# Patient Record
Sex: Male | Born: 1985 | Race: White | Hispanic: No | Marital: Single | Smoking: Never smoker
Health system: Southern US, Community
[De-identification: ages and names within clinical notes are randomized; demographics above are authoritative.]

## PROBLEM LIST (undated history)

## (undated) DIAGNOSIS — R011 Cardiac murmur, unspecified: Secondary | ICD-10-CM

## (undated) DIAGNOSIS — F988 Other specified behavioral and emotional disorders with onset usually occurring in childhood and adolescence: Secondary | ICD-10-CM

## (undated) DIAGNOSIS — T7840XA Allergy, unspecified, initial encounter: Secondary | ICD-10-CM

## (undated) HISTORY — PX: HERNIA REPAIR: SHX51

## (undated) HISTORY — DX: Other specified behavioral and emotional disorders with onset usually occurring in childhood and adolescence: F98.8

## (undated) HISTORY — DX: Cardiac murmur, unspecified: R01.1

## (undated) HISTORY — DX: Allergy, unspecified, initial encounter: T78.40XA

---

## 2008-01-27 ENCOUNTER — Ambulatory Visit (HOSPITAL_COMMUNITY): Admission: RE | Admit: 2008-01-27 | Discharge: 2008-01-27 | Payer: Self-pay | Admitting: General Surgery

## 2010-08-14 NOTE — Op Note (Signed)
Leroy Walker, Leroy Walker                ACCOUNT NO.:  [id number]   MEDICAL RECORD NO.:  [id number]          PATIENT TYPE:  AMB   LOCATION:  SDS                          FACILITY:  [facility]   PHYSICIAN:  Angelia Mould. Derrell Lolling, M.D.DATE OF BIRTH:  01-08-1986   DATE OF PROCEDURE:  01/27/2008  DATE OF DISCHARGE:                               OPERATIVE REPORT   PREOPERATIVE DIAGNOSIS:  Left inguinal hernia.   POSTOPERATIVE DIAGNOSIS:  Left inguinal hernia.   OPERATION PERFORMED:  Repair of left inguinal hernia with UltraPro mesh  Office manager).   SURGEON:  Angelia Mould. Derrell Lolling, MD   OPERATIVE INDICATIONS:  This a 25 year old man, college student who  noticed a bulge in his left groin several months ago.  He was evaluated  and found to have a large reducible left inguinal hernia.  He has  elected to have this repaired at this time.  He has been counseled as an  outpatient.  He was brought to operating room electively.   OPERATIVE FINDINGS:  The patient had a huge indirect left inguinal  hernia.  There was a large volume of omentum within the hernia sac that  was reducible.  There were no adhesions in the hernia sac.   OPERATIVE TECHNIQUE:  Following the induction of general endotracheal  anesthesia, the patient's lower abdomen, groin, and genitalia were  prepped and draped in a sterile fashion.  Intravenous antibiotics were  given.  A time-out was held identifying the patient, procedure, and  correct site.  A 0.5% Marcaine with epinephrine was used as local  infiltration anesthetic.  A transverse slightly oblique incision was  made in the left groin overlying the inguinal canal.  Dissection was  carried down through the subcutaneous tissue exposing the aponeurosis of  the external oblique.  The external oblique was incised in the direction  of its fibers opening up the external inguinal ring.  The external  oblique was dissected away from the underlying tissues and self-  retaining  retractors were placed.  There was one sensory nerve  intimately associated with the cord structures.  This was isolated and  dissected all the way back as far lateral as possible from its emergence  from the muscle.  It was clamped, divided, and ligated with 2-0 silk  ties.  The distal nerve was resected.  The cord structures were  mobilized and encircled with a Penrose drain.  Cremasteric muscle fibers  were skeletonized circumferentially.  I dissected his very large  indirect hernia sac away from the cord structures all the way as far  distal as possible.  I opened the sac and then with ring forceps reduced  all the omentum back into the abdominal cavity.  I then amputated the  sac distally, leaving almost no sac behind with the testicle. I  dissected the indirect sac away from cord structures all the way back to  the level of the internal ring.  The indirect sac was then twisted and  then suture ligated at the level of the internal ring with suture  ligature of 2-0 silk.  The redundant sac  was excised and discarded.  The  floor of the inguinal canal was repaired and reinforced with an onlay  graft of UltraPro mesh, 3 inch x 6 inch size.  This mesh was trimmed at  the corners to accommodate the wound.  The mesh was sutured in place  with interrupted mattress sutures of 2-0 Prolene and running sutures of  2-0 Prolene.  The mesh was sutured so as to generously overlap the  fascia at the pubic tubercle, then along the inguinal ligament  inferiorly.  Medially and superomedially multiple interrupted mattress  sutures of 2-0 Prolene were placed.  Superolaterally, a running suture  of 2-0 Prolene was used.  The mesh was incised laterally so as to wrap  around the cord structures of the internal ring.  The suture lines were  completed laterally overlapping the tails of the mesh.  A couple of  sutures were placed, one medial and one lateral to the cord structures  to tighten up the mesh.  This  provided very secure repair both medial  and lateral to the cord structures, but allowed a fingertip opening for  the cord structures.  Hemostasis was excellent.  The wound was irrigated  with saline.  The external oblique was closed with running suture of 2-0  Vicryl placing the cord structures deep to the external oblique.  Scarpa  fascia was closed with 3-0 Vicryl sutures and skin closed with running  subcuticular suture of 4-0 Monocryl and Dermabond.  Clean bandages were  placed and the patient taken to recovery room in stable condition.  Estimated blood loss was about 10 mL.  Complications none.  Sponge,  needle, and instrument counts were correct.      Angelia Mould. Derrell Lolling, M.D.  Electronically Signed     HMI/MEDQ  D:  01/27/2008  T:  01/27/2008  Job:  [id number]   cc:   Harrel Lemon. Merla Riches, M.D.

## 2011-01-01 LAB — CBC
MCHC: 35.2
Platelets: 185
RBC: 4.61
WBC: 6.3

## 2012-07-16 ENCOUNTER — Ambulatory Visit (INDEPENDENT_AMBULATORY_CARE_PROVIDER_SITE_OTHER): Payer: BC Managed Care – PPO | Admitting: Family Medicine

## 2012-07-16 VITALS — BP 143/71 | HR 97 | Temp 98.3°F | Resp 16 | Ht 68.5 in | Wt 204.8 lb

## 2012-07-16 DIAGNOSIS — L237 Allergic contact dermatitis due to plants, except food: Secondary | ICD-10-CM

## 2012-07-16 DIAGNOSIS — L255 Unspecified contact dermatitis due to plants, except food: Secondary | ICD-10-CM

## 2012-07-16 MED ORDER — PREDNISONE 20 MG PO TABS: ORAL_TABLET | ORAL | Status: DC

## 2012-07-16 NOTE — Patient Instructions (Addendum)
1.  TAKE BENADRYL 25MG  ONE AT BEDTIME FOR ITCHING. 2.  TAKE ZYRTEC 10MG  DAILY FOR ITCHING. 3.  TAKE PREDNISONE DAILY FOR ALLERGIC REACTION.     Poison [employer] ivy is a inflammation of the skin (contact dermatitis) caused by touching the allergens on the leaves of the ivy plant following previous exposure to the plant. The rash usually appears 48 hours after exposure. The rash is usually bumps (papules) or blisters (vesicles) in a linear pattern. Depending on your own sensitivity, the rash may simply cause redness and itching, or it may also progress to blisters which may break open. These must be well cared for to prevent secondary bacterial (germ) infection, followed by scarring. Keep any open areas dry, clean, dressed, and covered with an antibacterial ointment if needed. The eyes may also get puffy. The puffiness is worst in the morning and gets better as the day progresses. This dermatitis usually heals without scarring, within 2 to 3 weeks without treatment. HOME CARE INSTRUCTIONS  Thoroughly wash with soap and water as soon as you have been exposed to poison ivy. You have about one half hour to remove the plant resin before it will cause the rash. This washing will destroy the oil or antigen on the skin that is causing, or will cause, the rash. Be sure to wash under your fingernails as any plant resin there will continue to spread the rash. Do not rub skin vigorously when washing affected area. Poison ivy cannot spread if no oil from the plant remains on your body. A rash that has progressed to weeping sores will not spread the rash unless you have not washed thoroughly. It is also important to wash any clothes you have been wearing as these may carry active allergens. The rash will return if you wear the unwashed clothing, even several days later. Avoidance of the plant in the future is the best measure. Poison ivy plant can be recognized by the number of leaves. Generally, poison ivy has three  leaves with flowering branches on a single stem. Diphenhydramine may be purchased over the counter and used as needed for itching. Do not drive with this medication if it makes you drowsy.Ask your caregiver about medication for children. SEEK MEDICAL CARE IF:  Open sores develop.  Redness spreads beyond area of rash.  You notice purulent (pus-like) discharge.  You have increased pain.  Other signs of infection develop (such as fever). Document Released: 03/15/2000 Document Revised: 06/10/2011 Document Reviewed: 02/01/2009 [facility] Patient Information 2013 [place], [place].

## 2012-07-16 NOTE — Progress Notes (Signed)
[address]   [place], [place]  [address]   [phone]  Subjective:    Patient ID: Leroy Walker, male    DOB: 02-Mar-1986, 27 y.o.   MRN: [id number]  HPI This 27 y.o. male presents for evaluation of vesicular rash.  Multiple exposures in past; previously worked in [employer].  Onset four days ago.  Went biking over the weekend; also went golfing over the weekend.  No fever/chills/sweats.  Bothers mostly at night.  No mucosal involvement.  Mostly arms, legs, abdominal region, genital region.    Review of Systems  Constitutional: Negative for fever, chills, diaphoresis and fatigue.  Eyes: Negative for pain, redness and itching.  Respiratory: Negative for shortness of breath.   Skin: Positive for color change, rash and wound. Negative for pallor.        Past Medical History  Diagnosis Date  . Allergy   . Heart murmur     Past Surgical History  Procedure Laterality Date  . Hernia repair      Prior to Admission medications   Medication Sig Start Date End Date Taking? Authorizing Provider  Multiple Vitamin (MULTIVITAMIN) tablet Take 1 tablet by mouth daily.   Yes Historical Provider, MD  predniSONE (DELTASONE) 20 MG tablet Three tablets daily x 1 day, then two tablets daily x 5 days, then one tablet daily x 5 days 07/16/12   Ethelda Chick, MD    Allergies  Allergen Reactions  . Cephalosporins Hives  . Penicillins Hives    History   Social History  . Marital Status: Single    Spouse Name: N/A    Number of Children: N/A  . Years of Education: N/A   Occupational History  . Not on file.   Social History Main Topics  . Smoking status: Never Smoker   . Smokeless tobacco: Not on file  . Alcohol Use: 2.4 oz/week    4 Cans of beer per week  . Drug Use: Yes    Special: Marijuana  . Sexually Active: No   Other Topics Concern  . Not on file   Social History Narrative   Employment: Emergency planning/management officer    Family History  Problem Relation Age of Onset  . Muscular  dystrophy Mother     Objective:   Physical Exam  Nursing note and vitals reviewed. Constitutional: He is oriented to person, place, and time. He appears well-developed and well-nourished. No distress.  Neurological: He is alert and oriented to person, place, and time.  Skin: He is not diaphoretic.  Maculopapular rash with linear distribution; scattered vesicles along rash along B forearms, B legs, scrotal region. No associated pustules or streaking.  Psychiatric: He has a normal mood and affect. His behavior is normal. Judgment and thought content normal.       Assessment & Plan:  Poison ivy dermatitis - Plan: predniSONE (DELTASONE) 20 MG tablet     1. Poison Ivy Dermatitis:  New.  rx for Prednisone provided for next eleven days; recommend Benadryl qhs and Zyrtec every morning.  Local wound care.  RTC for s/s secondary infection; discussed s/s.    Meds ordered this encounter  Medications  . Multiple Vitamin (MULTIVITAMIN) tablet    Sig: Take 1 tablet by mouth daily.  . predniSONE (DELTASONE) 20 MG tablet    Sig: Three tablets daily x 1 day, then two tablets daily x 5 days, then one tablet daily x 5 days    Dispense:  18 tablet  Refill:  0

## 2014-01-04 ENCOUNTER — Ambulatory Visit (INDEPENDENT_AMBULATORY_CARE_PROVIDER_SITE_OTHER): Payer: BC Managed Care – PPO | Admitting: Internal Medicine

## 2014-01-04 VITALS — BP 124/82 | HR 76 | Temp 98.3°F | Resp 16 | Ht 69.5 in | Wt 220.6 lb

## 2014-01-04 DIAGNOSIS — J302 Other seasonal allergic rhinitis: Secondary | ICD-10-CM

## 2014-01-04 DIAGNOSIS — J018 Other acute sinusitis: Secondary | ICD-10-CM

## 2014-01-04 MED ORDER — FLUTICASONE PROPIONATE 50 MCG/ACT NA SUSP
2.0000 | Freq: Every day | NASAL | Status: DC
Start: 2014-01-04 — End: 2014-08-11

## 2014-01-04 MED ORDER — AZITHROMYCIN 500 MG PO TABS: 500.0000 mg | ORAL_TABLET | Freq: Every day | ORAL | Status: DC

## 2014-01-04 NOTE — Patient Instructions (Signed)
Allergic Rhinitis Allergic rhinitis is when the mucous membranes in the nose respond to allergens. Allergens are particles in the air that cause your body to have an allergic reaction. This causes you to release allergic antibodies. Through a chain of events, these eventually cause you to release histamine into the blood stream. Although meant to protect the body, it is this release of histamine that causes your discomfort, such as frequent sneezing, congestion, and an itchy, runny nose.  CAUSES  Seasonal allergic rhinitis (hay fever) is caused by pollen allergens that may come from grasses, trees, and weeds. Year-round allergic rhinitis (perennial allergic rhinitis) is caused by allergens such as house dust mites, pet dander, and mold spores.  SYMPTOMS   Nasal stuffiness (congestion).  Itchy, runny nose with sneezing and tearing of the eyes. DIAGNOSIS  Your health care provider can help you determine the allergen or allergens that trigger your symptoms. If you and your health care provider are unable to determine the allergen, skin or blood testing may be used. TREATMENT  Allergic rhinitis does not have a cure, but it can be controlled by:  Medicines and allergy shots (immunotherapy).  Avoiding the allergen. Hay fever may often be treated with antihistamines in pill or nasal spray forms. Antihistamines block the effects of histamine. There are over-the-counter medicines that may help with nasal congestion and swelling around the eyes. Check with your health care provider before taking or giving this medicine.  If avoiding the allergen or the medicine prescribed do not work, there are many new medicines your health care provider can prescribe. Stronger medicine may be used if initial measures are ineffective. Desensitizing injections can be used if medicine and avoidance does not work. Desensitization is when a patient is given ongoing shots until the body becomes less sensitive to the allergen.  Make sure you follow up with your health care provider if problems continue. HOME CARE INSTRUCTIONS It is not possible to completely avoid allergens, but you can reduce your symptoms by taking steps to limit your exposure to them. It helps to know exactly what you are allergic to so that you can avoid your specific triggers. SEEK MEDICAL CARE IF:   You have a fever.  You develop a cough that does not stop easily (persistent).  You have shortness of breath.  You start wheezing.  Symptoms interfere with normal daily activities. Document Released: 12/11/2000 Document Revised: 03/23/2013 Document Reviewed: 11/23/2012 Exit[employer] Patient Information 2015 [employer]. This information is not intended to replace advice given to you by your health care provider. Make sure you discuss any questions you have with your health care provider. Sinusitis Sinusitis is redness, soreness, and inflammation of the paranasal sinuses. Paranasal sinuses are air pockets within the bones of your face (beneath the eyes, the middle of the forehead, or above the eyes). In healthy paranasal sinuses, mucus is able to drain out, and air is able to circulate through them by way of your nose. However, when your paranasal sinuses are inflamed, mucus and air can become trapped. This can allow bacteria and other germs to grow and cause infection. Sinusitis can develop quickly and last only a short time (acute) or continue over a long period (chronic). Sinusitis that lasts for more than 12 weeks is considered chronic.  CAUSES  Causes of sinusitis include:  Allergies.  Structural abnormalities, such as displacement of the cartilage that separates your nostrils (deviated septum), which can decrease the air flow through your nose and sinuses and affect sinus   drainage.  Functional abnormalities, such as when the small hairs (cilia) that line your sinuses and help remove mucus do not work properly or are not present. SIGNS AND  SYMPTOMS  Symptoms of acute and chronic sinusitis are the same. The primary symptoms are pain and pressure around the affected sinuses. Other symptoms include:  Upper toothache.  Earache.  Headache.  Bad breath.  Decreased sense of smell and taste.  A cough, which worsens when you are lying flat.  Fatigue.  Fever.  Thick drainage from your nose, which often is green and may contain pus (purulent).  Swelling and warmth over the affected sinuses. DIAGNOSIS  Your health care provider will perform a physical exam. During the exam, your health care provider may:  Look in your nose for signs of abnormal growths in your nostrils (nasal polyps).  Tap over the affected sinus to check for signs of infection.  View the inside of your sinuses (endoscopy) using an imaging device that has a light attached (endoscope). If your health care provider suspects that you have chronic sinusitis, one or more of the following tests may be recommended:  Allergy tests.  Nasal culture. A sample of mucus is taken from your nose, sent to a lab, and screened for bacteria.  Nasal cytology. A sample of mucus is taken from your nose and examined by your health care provider to determine if your sinusitis is related to an allergy. TREATMENT  Most cases of acute sinusitis are related to a viral infection and will resolve on their own within 10 days. Sometimes medicines are prescribed to help relieve symptoms (pain medicine, decongestants, nasal steroid sprays, or saline sprays).  However, for sinusitis related to a bacterial infection, your health care provider will prescribe antibiotic medicines. These are medicines that will help kill the bacteria causing the infection.  Rarely, sinusitis is caused by a fungal infection. In theses cases, your health care provider will prescribe antifungal medicine. For some cases of chronic sinusitis, surgery is needed. Generally, these are cases in which sinusitis recurs  more than 3 times per year, despite other treatments. HOME CARE INSTRUCTIONS   Drink plenty of water. Water helps thin the mucus so your sinuses can drain more easily.  Use a humidifier.  Inhale steam 3 to 4 times a day (for example, sit in the bathroom with the shower running).  Apply a warm, moist washcloth to your face 3 to 4 times a day, or as directed by your health care provider.  Use saline nasal sprays to help moisten and clean your sinuses.  Take medicines only as directed by your health care provider.  If you were prescribed either an antibiotic or antifungal medicine, finish it all even if you start to feel better. SEEK IMMEDIATE MEDICAL CARE IF:  You have increasing pain or severe headaches.  You have nausea, vomiting, or drowsiness.  You have swelling around your face.  You have vision problems.  You have a stiff neck.  You have difficulty breathing. MAKE SURE YOU:   Understand these instructions.  Will watch your condition.  Will get help right away if you are not doing well or get worse. Document Released: 03/18/2005 Document Revised: 08/02/2013 Document Reviewed: 04/02/2011 Exit[employer] Patient Information 2015 [employer]. This information is not intended to replace advice given to you by your health care provider. Make sure you discuss any questions you have with your health care provider.  

## 2014-01-04 NOTE — Progress Notes (Signed)
   Subjective:    Patient ID: Leroy Walker, male    DOB: 1985-12-18, 28 y.o.   MRN: [id number]  HPI  Patient is being seen at clinic for his sinus. He noticed it for 3 to 5 days. He has a lot sinus pressure with nasal congestion and yellowish/green mucous. Most of the pressure around the eyes and nose. Sore throat is more like a tingle and starting to have difficulty to swallow. Patient had some chills and some night sweats. Some pressure around the ears. No wheezing. No coughing. Patient denies any fever. No N/V. Patient has seasonal allergies and felt worse when the season changes.  He travels a lot for work.   Review of Systems     Objective:   Physical Exam        Assessment & Plan:  Sinusitis/allergic rhinitis Zithromax 500mg /Fluticasone nasal

## 2014-08-11 ENCOUNTER — Telehealth: Payer: Self-pay

## 2014-08-11 DIAGNOSIS — J018 Other acute sinusitis: Secondary | ICD-10-CM

## 2014-08-11 DIAGNOSIS — J302 Other seasonal allergic rhinitis: Secondary | ICD-10-CM

## 2014-08-11 MED ORDER — FLUTICASONE PROPIONATE 50 MCG/ACT NA SUSP: 2.0000 | Freq: Every day | NASAL | Status: DC

## 2014-08-11 NOTE — Telephone Encounter (Signed)
Rx sent, but needs OV for additional refills. Reminder on Rx.

## 2014-08-11 NOTE — Telephone Encounter (Signed)
Pt requesting refill on fluticasone (FLONASE) 50 MCG/ACT nasal spray [[id number]][[id number]]

## 2015-09-20 ENCOUNTER — Ambulatory Visit (INDEPENDENT_AMBULATORY_CARE_PROVIDER_SITE_OTHER): Payer: BLUE CROSS/BLUE SHIELD | Admitting: Internal Medicine

## 2015-09-20 VITALS — BP 132/68 | HR 88 | Temp 99.3°F | Resp 16 | Wt 199.2 lb

## 2015-09-20 DIAGNOSIS — J039 Acute tonsillitis, unspecified: Secondary | ICD-10-CM

## 2015-09-20 LAB — POCT RAPID STREP A (OFFICE): RAPID STREP A SCREEN: POSITIVE — AB

## 2015-09-20 MED ORDER — PREDNISONE 20 MG PO TABS
ORAL_TABLET | ORAL | Status: DC
Start: 2015-09-20 — End: 2018-03-03

## 2015-09-20 MED ORDER — CLARITHROMYCIN 500 MG PO TABS: 500.0000 mg | ORAL_TABLET | Freq: Two times a day (BID) | ORAL | Status: DC

## 2015-09-20 MED ORDER — LIDOCAINE VISCOUS 2 % MT SOLN: OROMUCOSAL | Status: DC

## 2015-09-20 NOTE — Progress Notes (Signed)
Subjective:  By signing my name below, I, Stann Ore, attest that this documentation has been prepared under the direction and in the presence of Ellamae Sia, MD. Electronically Signed: Stann Ore, Scribe. 09/20/2015 , 3:53 PM .  Patient was seen in Room 8 .   Patient ID: Leroy Walker III, male    DOB: 12-12-1985, 29 y.o.   MRN: [id number] Chief Complaint  Patient presents with  . Sore Throat    x 2 days  . Sinusitis    x 2 days    HPI Leroy Walker III is a 30 y.o. male who presents to [facility] complaining of sore throat and sinus pressure that started 2 days ago. 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 He mentions having a slight fever and difficulty sleeping. He's taken flonase without relief. He denies nasal congestion or cough.   He's had mono when he was younger.   There are no active problems to display for this patient.   Current outpatient prescriptions:  .  fluticasone (FLONASE) 50 MCG/ACT nasal spray, Place 2 sprays into both nostrils daily., Disp: 16 g, Rfl: 0 .  Multiple Vitamin (MULTIVITAMIN) tablet, Take 1 tablet by mouth daily., Disp: , Rfl:  .  azithromycin (ZITHROMAX) 500 MG tablet, Take 1 tablet (500 mg total) by mouth daily. (Patient not taking: Reported on 09/20/2015), Disp: 5 tablet, Rfl: 0 .  predniSONE (DELTASONE) 20 MG tablet, Three tablets daily x 1 day, then two tablets daily x 5 days, then one tablet daily x 5 days (Patient not taking: Reported on 09/20/2015), Disp: 18 tablet, Rfl: 0 Allergies  Allergen Reactions  . Cephalosporins Hives  . Penicillins Hives   Review of Systems  Constitutional: Positive for fever, appetite change and fatigue. Negative for chills.  HENT: Positive for sinus pressure and sore throat. Negative for congestion and rhinorrhea.   Respiratory: Negative for cough, shortness of breath and wheezing.   Gastrointestinal: Negative for nausea, vomiting and diarrhea.       Objective:   Physical Exam  Constitutional: He is oriented to person, place, and time. He appears well-developed and well-nourished. No distress.  HENT:  Head: Normocephalic and atraumatic.  Mouth/Throat: Posterior oropharyngeal erythema present. No oropharyngeal exudate.  3+ tonsils   Eyes: EOM are normal. Pupils are equal, round, and reactive to light.  Neck: Neck supple.  Cardiovascular: Normal rate.   Pulmonary/Chest: Effort normal. No respiratory distress.  Musculoskeletal: Normal range of motion.  Lymphadenopathy:  Minimal ac nodes  Neurological: He is alert and oriented to person, place, and time.  Skin: Skin is warm and dry.  Psychiatric: He has a normal mood and affect. His behavior is normal.  Nursing note and vitals reviewed.   BP 132/68 mmHg  Pulse 88  Temp(Src) 99.3 F (37.4 C) (Oral)  Resp 16  Wt 199 lb 3.2 oz (90.357 kg)  SpO2 99%   Results for orders placed or performed in visit on 09/20/15  POCT rapid strep A  Result Value Ref Range   Rapid Strep A Screen Positive (A) Negative       Assessment & Plan:  Acute tonsillitis, unspecified etiology - Plan: POCT rapid strep A  Meds ordered this encounter  Medications  . clarithromycin (BIAXIN) 500 MG tablet    Sig: Take 1 tablet (500 mg total) by mouth 2 (two) times daily.    Dispense:  20 tablet    Refill:  0  . lidocaine (XYLOCAINE) 2 % solution  Sig: Use 1 teaspoon every 2 hours to swish and swallow or spit as needed for pain    Dispense:  60 mL    Refill:  0  . predniSONE (DELTASONE) 20 MG tablet    Sig: 3/2/1 single daily dose for 3 days    Dispense:  6 tablet    Refill:  0   I have completed the patient encounter in its entirety as documented by the scribe, with editing by me where necessary. Robert P. Merla Richesoolittle, M.D.

## 2018-03-01 ENCOUNTER — Encounter (HOSPITAL_COMMUNITY): Payer: Self-pay

## 2018-03-01 ENCOUNTER — Observation Stay (HOSPITAL_COMMUNITY)
Admission: EM | Admit: 2018-03-01 | Discharge: 2018-03-03 | Disposition: A | Discharge status: Home / Self Care | Payer: PRIVATE HEALTH INSURANCE | Attending: Orthopaedic Surgery | Admitting: Orthopaedic Surgery

## 2018-03-01 ENCOUNTER — Emergency Department (HOSPITAL_COMMUNITY): Payer: PRIVATE HEALTH INSURANCE

## 2018-03-01 ENCOUNTER — Other Ambulatory Visit: Payer: Self-pay

## 2018-03-01 DIAGNOSIS — Y9322 Activity, ice hockey: Secondary | ICD-10-CM | POA: Insufficient documentation

## 2018-03-01 DIAGNOSIS — Z79899 Other long term (current) drug therapy: Secondary | ICD-10-CM | POA: Diagnosis not present

## 2018-03-01 DIAGNOSIS — Z7951 Long term (current) use of inhaled steroids: Secondary | ICD-10-CM | POA: Diagnosis not present

## 2018-03-01 DIAGNOSIS — S82831A Other fracture of upper and lower end of right fibula, initial encounter for closed fracture: Secondary | ICD-10-CM | POA: Diagnosis not present

## 2018-03-01 DIAGNOSIS — S82241A Displaced spiral fracture of shaft of right tibia, initial encounter for closed fracture: Principal | ICD-10-CM | POA: Insufficient documentation

## 2018-03-01 DIAGNOSIS — W1830XA Fall on same level, unspecified, initial encounter: Secondary | ICD-10-CM | POA: Diagnosis not present

## 2018-03-01 DIAGNOSIS — S82251A Displaced comminuted fracture of shaft of right tibia, initial encounter for closed fracture: Secondary | ICD-10-CM | POA: Diagnosis present

## 2018-03-01 DIAGNOSIS — Z09 Encounter for follow-up examination after completed treatment for conditions other than malignant neoplasm: Secondary | ICD-10-CM

## 2018-03-01 DIAGNOSIS — T1490XA Injury, unspecified, initial encounter: Secondary | ICD-10-CM

## 2018-03-01 LAB — CBC WITH DIFFERENTIAL/PLATELET
Abs Immature Granulocytes: 0.13 10*3/uL — ABNORMAL HIGH (ref 0.00–0.07)
Basophils Absolute: 0 10*3/uL (ref 0.0–0.1)
Basophils Relative: 0 %
EOS PCT: 0 %
Eosinophils Absolute: 0 10*3/uL (ref 0.0–0.5)
HCT: 42.6 % (ref 39.0–52.0)
Hemoglobin: 14.3 g/dL (ref 13.0–17.0)
Immature Granulocytes: 1 %
Lymphocytes Relative: 10 %
Lymphs Abs: 1.4 10*3/uL (ref 0.7–4.0)
MCH: 33.1 pg (ref 26.0–34.0)
MCHC: 33.6 g/dL (ref 30.0–36.0)
MCV: 98.6 fL (ref 80.0–100.0)
MONO ABS: 0.8 10*3/uL (ref 0.1–1.0)
Monocytes Relative: 6 %
Neutro Abs: 11.9 10*3/uL — ABNORMAL HIGH (ref 1.7–7.7)
Neutrophils Relative %: 83 %
Platelets: 244 10*3/uL (ref 150–400)
RBC: 4.32 MIL/uL (ref 4.22–5.81)
RDW: 12.1 % (ref 11.5–15.5)
WBC: 14.3 10*3/uL — ABNORMAL HIGH (ref 4.0–10.5)
nRBC: 0 % (ref 0.0–0.2)

## 2018-03-01 LAB — BASIC METABOLIC PANEL
Anion gap: 12 (ref 5–15)
BUN: 17 mg/dL (ref 6–20)
CO2: 21 mmol/L — AB (ref 22–32)
Calcium: 9.3 mg/dL (ref 8.9–10.3)
Chloride: 104 mmol/L (ref 98–111)
Creatinine, Ser: 0.86 mg/dL (ref 0.61–1.24)
GFR calc Af Amer: >60 mL/min (ref >60)
GFR calc non Af Amer: >60 mL/min (ref >60)
Glucose, Bld: 104 mg/dL — ABNORMAL HIGH (ref 70–99)
Potassium: 3.6 mmol/L (ref 3.5–5.1)
Sodium: 137 mmol/L (ref 135–145)

## 2018-03-01 MED ORDER — DOCUSATE SODIUM 100 MG PO CAPS
100.0000 mg | ORAL_CAPSULE | Freq: Two times a day (BID) | ORAL | Status: DC
  Administered 2018-03-02 – 2018-03-03 (×3): 100 mg via ORAL
  Filled 2018-03-01 (×3): qty 1

## 2018-03-01 MED ORDER — ONDANSETRON HCL 4 MG PO TABS: 4.0000 mg | ORAL_TABLET | Freq: Four times a day (QID) | ORAL | Status: DC | PRN

## 2018-03-01 MED ORDER — HYDROCODONE-ACETAMINOPHEN 5-325 MG PO TABS
2.0000 | ORAL_TABLET | Freq: Once | ORAL | Status: AC
  Administered 2018-03-01: 2 via ORAL
  Filled 2018-03-01: qty 2

## 2018-03-01 MED ORDER — FENTANYL CITRATE (PF) 100 MCG/2ML IJ SOLN
100.0000 ug | Freq: Once | INTRAMUSCULAR | Status: AC
  Administered 2018-03-01: 100 ug via INTRAMUSCULAR
  Filled 2018-03-01: qty 2

## 2018-03-01 MED ORDER — DIPHENHYDRAMINE HCL 12.5 MG/5ML PO ELIX: 12.5000 mg | ORAL_SOLUTION | ORAL | Status: DC | PRN

## 2018-03-01 MED ORDER — METHOCARBAMOL 1000 MG/10ML IJ SOLN: 500.0000 mg | Freq: Four times a day (QID) | INTRAVENOUS | Status: DC | PRN

## 2018-03-01 MED ORDER — ONDANSETRON HCL 4 MG/2ML IJ SOLN: 4.0000 mg | Freq: Four times a day (QID) | INTRAMUSCULAR | Status: DC | PRN

## 2018-03-01 MED ORDER — ACETAMINOPHEN 500 MG PO TABS
1000.0000 mg | ORAL_TABLET | Freq: Four times a day (QID) | ORAL | Status: DC
  Administered 2018-03-02 (×2): 1000 mg via ORAL
  Filled 2018-03-01 (×2): qty 2

## 2018-03-01 MED ORDER — OXYCODONE HCL 5 MG PO TABS
5.0000 mg | ORAL_TABLET | ORAL | Status: DC | PRN
  Administered 2018-03-02 – 2018-03-03 (×4): 10 mg via ORAL
  Filled 2018-03-01 (×4): qty 2

## 2018-03-01 MED ORDER — MORPHINE SULFATE (PF) 2 MG/ML IV SOLN
2.0000 mg | INTRAVENOUS | Status: DC | PRN
  Administered 2018-03-02 – 2018-03-03 (×4): 2 mg via INTRAVENOUS
  Filled 2018-03-01 (×4): qty 1

## 2018-03-01 MED ORDER — METHOCARBAMOL 500 MG PO TABS
500.0000 mg | ORAL_TABLET | Freq: Four times a day (QID) | ORAL | Status: DC | PRN
  Administered 2018-03-02 – 2018-03-03 (×4): 500 mg via ORAL
  Filled 2018-03-01 (×4): qty 1

## 2018-03-01 MED ORDER — FENTANYL CITRATE (PF) 100 MCG/2ML IJ SOLN
50.0000 ug | Freq: Once | INTRAMUSCULAR | Status: AC
  Administered 2018-03-01: 50 ug via INTRAVENOUS
  Filled 2018-03-01: qty 2

## 2018-03-01 MED ORDER — LACTATED RINGERS IV SOLN
INTRAVENOUS | Status: DC
  Administered 2018-03-02 (×3): via INTRAVENOUS

## 2018-03-01 NOTE — Consult Note (Signed)
Patient with displaced tibial shaft fracture, will require IM nail tomorrow, will meet patient in morning, low energy, reportedly compartments soft.

## 2018-03-01 NOTE — ED Notes (Signed)
Report given to [facility] 

## 2018-03-01 NOTE — ED Provider Notes (Signed)
[facility] Note   CSN: [id number] Arrival date & time: 03/01/18  2102     History   Chief Complaint Chief Complaint  Patient presents with  . Leg Injury    HPI Leroy Walker is a 32 y.o. male past medical history of ADHD who presents for evaluation of right lower leg pain that began after mechanical fall while playing hockey.  Patient reports that he was playing ice hockey and states that he was collided with a player, causing him to get knocked down.  He states that his leg bent different way.  He has not been able to ambulate or bear weight on the leg since the incident.  He has not taken medication for pain.  Patient denies any numbness/weakness.  He denies any head injury or LOC.  The history is provided by the patient.    Past Medical History:  Diagnosis Date  . Allergy   . Attention deficit disorder (ADD)   . Heart murmur     Patient Active Problem List   Diagnosis Date Noted  . Displaced comminuted fracture of shaft of right tibia, initial encounter for closed fracture 03/01/2018    Past Surgical History:  Procedure Laterality Date  . HERNIA REPAIR          Home Medications    Prior to Admission medications   Medication Sig Start Date End Date Taking? Authorizing Provider  azithromycin (ZITHROMAX) 500 MG tablet Take 1 tablet (500 mg total) by mouth daily. Patient not taking: Reported on 09/20/2015 01/04/14   Jonita Albee, MD  clarithromycin (BIAXIN) 500 MG tablet Take 1 tablet (500 mg total) by mouth 2 (two) times daily. 09/20/15   Tonye Pearson, MD  fluticasone (FLONASE) 50 MCG/ACT nasal spray Place 2 sprays into both nostrils daily. 08/11/14   Jonita Albee, MD  lidocaine (XYLOCAINE) 2 % solution Use 1 teaspoon every 2 hours to swish and swallow or spit as needed for pain 09/20/15   Tonye Pearson, MD  Multiple Vitamin (MULTIVITAMIN) tablet Take 1 tablet by mouth daily.    [provider]    predniSONE (DELTASONE) 20 MG tablet 3/2/1 single daily dose for 3 days 09/20/15   Tonye Pearson, MD    Family History Family History  Problem Relation Age of Onset  . Muscular dystrophy Mother   . Stroke Maternal Grandfather   . Stroke Paternal Grandmother   . Stroke Paternal Grandfather     Social History Social History   Tobacco Use  . Smoking status: Never Smoker  . Smokeless tobacco: Never Used  Substance Use Topics  . Alcohol use: Yes    Alcohol/week: 4.0 standard drinks    Types: 4 Cans of beer per week  . Drug use: Yes    Types: Marijuana     Allergies   Cephalosporins and Penicillins   Review of Systems Review of Systems  Musculoskeletal:       Right leg pain  Neurological: Negative for weakness and numbness.  All other systems reviewed and are negative.    Physical Exam Updated Vital Signs BP 139/89 (BP Location: Right Arm)   Pulse 79   Temp 97.9 F (36.6 C) (Oral)   Resp 16   Ht 5\' 11"  (1.803 m)   Wt 90.7 kg   SpO2 100%   BMI 27.89 kg/m   Physical Exam  Constitutional: He appears well-developed and well-nourished.  HENT:  Head: Normocephalic and atraumatic.  Eyes:  Conjunctivae and EOM are normal. Right eye exhibits no discharge. Left eye exhibits no discharge. No scleral icterus.  Cardiovascular:  Pulses:      Dorsalis pedis pulses are 2+ on the right side, and 2+ on the left side.  Pulmonary/Chest: Effort normal.  Musculoskeletal:       Legs: Tenderness palpation noted to right lateral tib-fib with crepitus noted.  There is overlying soft tissue swelling and ecchymosis.  Good range of motion secondary to pain.  No bony tenderness noted to distal tib-fib, ankle, dorsal aspect of foot.  Patient is able to wiggle all 5 toes without any difficulty.  No tenderness palpation of the right knee.  No tenderness palpation noted to left lower extremity.  Neurological: He is alert.  Sensation intact along major nerve distributions of BLE  Skin:  Skin is warm and dry. Capillary refill takes less than 2 seconds.  Good distal cap refill.  LLE is not dusky in appearance or cool to touch.  Psychiatric: He has a normal mood and affect. His speech is normal and behavior is normal.  Nursing note and vitals reviewed.    ED Treatments / Results  Labs (all labs ordered are listed, but only abnormal results are displayed) Labs Reviewed  HIV ANTIBODY (ROUTINE TESTING W REFLEX)  BASIC METABOLIC PANEL  CBC WITH DIFFERENTIAL/PLATELET    EKG None  Radiology Dg Tibia/fibula Right  Result Date: 03/01/2018 CLINICAL DATA:  32 year old male with trauma to right lower extremity. EXAM: RIGHT TIBIA AND FIBULA - 2 VIEW; RIGHT ANKLE - COMPLETE 3+ VIEW COMPARISON:  None. FINDINGS: There is a mildly displaced oblique fracture of the proximal fibula. There is a displaced oblique fracture of the distal tibia with approximately 6 mm distraction gap and 1 cm anterior displacement of distal fracture fragment. The bones are well mineralized. There is no dislocation. There is subcutaneous soft tissue edema of the distal leg. IMPRESSION: Mildly displaced oblique fractures of the proximal fibula and distal tibia. No dislocation. Electronically Signed   By: Elgie Collard M.D.   On: 03/01/2018 22:27   Dg Ankle Complete Right  Result Date: 03/01/2018 CLINICAL DATA:  32 year old male with trauma to right lower extremity. EXAM: RIGHT TIBIA AND FIBULA - 2 VIEW; RIGHT ANKLE - COMPLETE 3+ VIEW COMPARISON:  None. FINDINGS: There is a mildly displaced oblique fracture of the proximal fibula. There is a displaced oblique fracture of the distal tibia with approximately 6 mm distraction gap and 1 cm anterior displacement of distal fracture fragment. The bones are well mineralized. There is no dislocation. There is subcutaneous soft tissue edema of the distal leg. IMPRESSION: Mildly displaced oblique fractures of the proximal fibula and distal tibia. No dislocation.  Electronically Signed   By: Elgie Collard M.D.   On: 03/01/2018 22:27    Procedures Procedures (including critical care time)  Medications Ordered in ED Medications  lactated ringers infusion (has no administration in time range)  acetaminophen (TYLENOL) tablet 1,000 mg (has no administration in time range)  oxyCODONE (Oxy IR/ROXICODONE) immediate release tablet 5-10 mg (has no administration in time range)  morphine 2 MG/ML injection 2 mg (has no administration in time range)  methocarbamol (ROBAXIN) tablet 500 mg (has no administration in time range)    Or  methocarbamol (ROBAXIN) 500 mg in dextrose 5 % 50 mL IVPB (has no administration in time range)  diphenhydrAMINE (BENADRYL) 12.5 MG/5ML elixir 12.5-25 mg (has no administration in time range)  docusate sodium (COLACE) capsule 100 mg (has no  administration in time range)  ondansetron (ZOFRAN) tablet 4 mg (has no administration in time range)    Or  ondansetron (ZOFRAN) injection 4 mg (has no administration in time range)  fentaNYL (SUBLIMAZE) injection 50 mcg (has no administration in time range)  fentaNYL (SUBLIMAZE) injection 100 mcg (100 mcg Intramuscular Given 03/01/18 2136)  HYDROcodone-acetaminophen (NORCO/VICODIN) 5-325 MG per tablet 2 tablet (2 tablets Oral Given 03/01/18 2235)     Initial Impression / Assessment and Plan / ED Course  I have reviewed the triage vital signs and the nursing notes.  Pertinent labs & imaging results that were available during my care of the patient were reviewed by me and considered in my medical decision making (see chart for details).     32 year old male who presents for evaluation of left lower extremity pain that began after mechanical fall playing hockey. Patient is afebrile, non-toxic appearing, sitting comfortably on examination table. Vital signs reviewed and stable.  Patient is neurovascularly intact.  Concern for fracture versus dislocation versus musculoskeletal injury.  X-rays  ordered at triage.  XR review. Patient with mildly displaced oblique fracture of the proximal fibula and distal tibia.  There is about 6 mm distraction gap with 1 cm anterior displacement of the distal fracture fragment.  Ortho consulted.   Discussed patient with Dr. Everardo PacificVarkey (Ortho) who reviewed patient's imaging.  He recommends patient be placed in a short leg splint with lateral stirrups.  He would [facility] for plans for surgical intervention tomorrow morning.  Updated patient on plan.  He is agreeable.   Final Clinical Impressions(s) / ED Diagnoses   Final diagnoses:  Other closed fracture of proximal end of right fibula, initial encounter  Closed displaced spiral fracture of shaft of right tibia, initial encounter    ED Discharge Orders    None       Maxwell CaulLayden, Ikran Patman A, PA-C 03/01/18 2304    Melene PlanFloyd, Dan, DO 03/02/18 0019

## 2018-03-01 NOTE — ED Notes (Signed)
Bed: WTR5 Expected date:  Expected time:  Means of arrival:  Comments: 

## 2018-03-01 NOTE — ED Triage Notes (Signed)
Pt reports that he was playing ice hockey and injured is R lower leg. Splinted with a clipboard. A&Ox4. No head injury.

## 2018-03-02 ENCOUNTER — Encounter (HOSPITAL_COMMUNITY): Payer: Self-pay | Admitting: Certified Registered"

## 2018-03-02 ENCOUNTER — Observation Stay (HOSPITAL_COMMUNITY): Payer: PRIVATE HEALTH INSURANCE

## 2018-03-02 ENCOUNTER — Observation Stay (HOSPITAL_COMMUNITY): Payer: PRIVATE HEALTH INSURANCE | Admitting: Anesthesiology

## 2018-03-02 ENCOUNTER — Encounter (HOSPITAL_COMMUNITY)
Admission: EM | Disposition: A | Discharge status: Home / Self Care | Payer: Self-pay | Source: Home / Self Care | Attending: Emergency Medicine

## 2018-03-02 HISTORY — PX: TIBIA IM NAIL INSERTION: SHX2516

## 2018-03-02 LAB — SURGICAL PCR SCREEN
MRSA, PCR: NEGATIVE
Staphylococcus aureus: POSITIVE — AB

## 2018-03-02 LAB — HIV ANTIBODY (ROUTINE TESTING W REFLEX): HIV SCREEN 4TH GENERATION: NONREACTIVE

## 2018-03-02 SURGERY — INSERTION, INTRAMEDULLARY ROD, TIBIA
Anesthesia: General | Site: Leg Lower | Laterality: Right

## 2018-03-02 MED ORDER — MUPIROCIN 2 % EX OINT: 1.0000 "application " | TOPICAL_OINTMENT | Freq: Two times a day (BID) | CUTANEOUS | Status: DC

## 2018-03-02 MED ORDER — SUCCINYLCHOLINE CHLORIDE 200 MG/10ML IV SOSY
PREFILLED_SYRINGE | INTRAVENOUS | Status: AC
  Filled 2018-03-02: qty 10

## 2018-03-02 MED ORDER — ONDANSETRON HCL 4 MG/2ML IJ SOLN
INTRAMUSCULAR | Status: AC
  Filled 2018-03-02: qty 2

## 2018-03-02 MED ORDER — EPHEDRINE 5 MG/ML INJ
INTRAVENOUS | Status: AC
  Filled 2018-03-02: qty 10

## 2018-03-02 MED ORDER — LIDOCAINE HCL (CARDIAC) PF 100 MG/5ML IV SOSY
PREFILLED_SYRINGE | INTRAVENOUS | Status: DC | PRN
  Administered 2018-03-02: 100 mg via INTRAVENOUS

## 2018-03-02 MED ORDER — DEXAMETHASONE SODIUM PHOSPHATE 10 MG/ML IJ SOLN
INTRAMUSCULAR | Status: DC | PRN
  Administered 2018-03-02: 4 mg via INTRAVENOUS

## 2018-03-02 MED ORDER — HYDROMORPHONE HCL 1 MG/ML IJ SOLN
0.2500 mg | INTRAMUSCULAR | Status: DC | PRN
  Administered 2018-03-02 (×4): 0.5 mg via INTRAVENOUS

## 2018-03-02 MED ORDER — PROPOFOL 10 MG/ML IV BOLUS
INTRAVENOUS | Status: AC
  Filled 2018-03-02: qty 20

## 2018-03-02 MED ORDER — CHLORHEXIDINE GLUCONATE CLOTH 2 % EX PADS
6.0000 | MEDICATED_PAD | Freq: Every day | CUTANEOUS | Status: DC
  Administered 2018-03-02 – 2018-03-03 (×2): 6 via TOPICAL

## 2018-03-02 MED ORDER — CLINDAMYCIN PHOSPHATE 600 MG/50ML IV SOLN
600.0000 mg | Freq: Four times a day (QID) | INTRAVENOUS | Status: AC
  Administered 2018-03-02 – 2018-03-03 (×2): 600 mg via INTRAVENOUS
  Filled 2018-03-02 (×2): qty 50

## 2018-03-02 MED ORDER — MUPIROCIN 2 % EX OINT
1.0000 "application " | TOPICAL_OINTMENT | Freq: Two times a day (BID) | CUTANEOUS | Status: DC
  Administered 2018-03-02 – 2018-03-03 (×3): 1 via NASAL
  Filled 2018-03-02 (×2): qty 22

## 2018-03-02 MED ORDER — VANCOMYCIN HCL 1000 MG IV SOLR
INTRAVENOUS | Status: AC
  Filled 2018-03-02: qty 1000

## 2018-03-02 MED ORDER — HYDROMORPHONE HCL 1 MG/ML IJ SOLN
INTRAMUSCULAR | Status: AC
  Filled 2018-03-02: qty 1

## 2018-03-02 MED ORDER — METOCLOPRAMIDE HCL 5 MG/ML IJ SOLN: 5.0000 mg | Freq: Three times a day (TID) | INTRAMUSCULAR | Status: DC | PRN

## 2018-03-02 MED ORDER — MIDAZOLAM HCL 5 MG/5ML IJ SOLN
INTRAMUSCULAR | Status: DC | PRN
  Administered 2018-03-02: 2 mg via INTRAVENOUS

## 2018-03-02 MED ORDER — DEXAMETHASONE SODIUM PHOSPHATE 10 MG/ML IJ SOLN
INTRAMUSCULAR | Status: AC
  Filled 2018-03-02: qty 1

## 2018-03-02 MED ORDER — MIDAZOLAM HCL 2 MG/2ML IJ SOLN
INTRAMUSCULAR | Status: AC
  Filled 2018-03-02: qty 2

## 2018-03-02 MED ORDER — FENTANYL CITRATE (PF) 250 MCG/5ML IJ SOLN
INTRAMUSCULAR | Status: AC
  Filled 2018-03-02: qty 5

## 2018-03-02 MED ORDER — CLINDAMYCIN PHOSPHATE 900 MG/50ML IV SOLN
900.0000 mg | Freq: Once | INTRAVENOUS | Status: AC
  Administered 2018-03-02: 900 mg via INTRAVENOUS
  Filled 2018-03-02: qty 50

## 2018-03-02 MED ORDER — ONDANSETRON HCL 4 MG PO TABS: 4.0000 mg | ORAL_TABLET | Freq: Four times a day (QID) | ORAL | Status: DC | PRN

## 2018-03-02 MED ORDER — PROMETHAZINE HCL 25 MG/ML IJ SOLN
6.2500 mg | INTRAMUSCULAR | Status: DC | PRN
  Administered 2018-03-02: 12.5 mg via INTRAVENOUS

## 2018-03-02 MED ORDER — SENNOSIDES-DOCUSATE SODIUM 8.6-50 MG PO TABS: 1.0000 | ORAL_TABLET | Freq: Every evening | ORAL | Status: DC | PRN

## 2018-03-02 MED ORDER — 0.9 % SODIUM CHLORIDE (POUR BTL) OPTIME
TOPICAL | Status: DC | PRN
  Administered 2018-03-02: 1000 mL

## 2018-03-02 MED ORDER — ACETAMINOPHEN 500 MG PO TABS
1000.0000 mg | ORAL_TABLET | Freq: Three times a day (TID) | ORAL | Status: DC
  Administered 2018-03-02 – 2018-03-03 (×3): 1000 mg via ORAL
  Filled 2018-03-02 (×3): qty 2

## 2018-03-02 MED ORDER — DOCUSATE SODIUM 100 MG PO CAPS: 100.0000 mg | ORAL_CAPSULE | Freq: Two times a day (BID) | ORAL | Status: DC

## 2018-03-02 MED ORDER — CELECOXIB 200 MG PO CAPS
200.0000 mg | ORAL_CAPSULE | Freq: Every day | ORAL | Status: DC
  Administered 2018-03-02: 200 mg via ORAL

## 2018-03-02 MED ORDER — ONDANSETRON HCL 4 MG/2ML IJ SOLN
INTRAMUSCULAR | Status: DC | PRN
  Administered 2018-03-02: 4 mg via INTRAVENOUS

## 2018-03-02 MED ORDER — MEPERIDINE HCL 50 MG/ML IJ SOLN: 6.2500 mg | INTRAMUSCULAR | Status: DC | PRN

## 2018-03-02 MED ORDER — METOCLOPRAMIDE HCL 5 MG PO TABS: 5.0000 mg | ORAL_TABLET | Freq: Three times a day (TID) | ORAL | Status: DC | PRN

## 2018-03-02 MED ORDER — KETOROLAC TROMETHAMINE 30 MG/ML IJ SOLN
INTRAMUSCULAR | Status: AC
  Filled 2018-03-02: qty 1

## 2018-03-02 MED ORDER — BUPIVACAINE HCL (PF) 0.25 % IJ SOLN
INTRAMUSCULAR | Status: DC | PRN
  Administered 2018-03-02: 10 mL

## 2018-03-02 MED ORDER — FENTANYL CITRATE (PF) 100 MCG/2ML IJ SOLN
INTRAMUSCULAR | Status: DC | PRN
  Administered 2018-03-02 (×2): 50 ug via INTRAVENOUS
  Administered 2018-03-02: 100 ug via INTRAVENOUS
  Administered 2018-03-02: 50 ug via INTRAVENOUS
  Administered 2018-03-02: 150 ug via INTRAVENOUS
  Administered 2018-03-02: 50 ug via INTRAVENOUS

## 2018-03-02 MED ORDER — SUGAMMADEX SODIUM 200 MG/2ML IV SOLN
INTRAVENOUS | Status: DC | PRN
  Administered 2018-03-02: 200 mg via INTRAVENOUS

## 2018-03-02 MED ORDER — HYDROMORPHONE HCL 1 MG/ML IJ SOLN
INTRAMUSCULAR | Status: AC
  Administered 2018-03-02: 0.5 mg via INTRAVENOUS
  Filled 2018-03-02: qty 1

## 2018-03-02 MED ORDER — ASPIRIN EC 325 MG PO TBEC
325.0000 mg | DELAYED_RELEASE_TABLET | Freq: Every day | ORAL | Status: DC
  Administered 2018-03-03: 325 mg via ORAL
  Filled 2018-03-02: qty 1

## 2018-03-02 MED ORDER — BUPIVACAINE HCL (PF) 0.25 % IJ SOLN
INTRAMUSCULAR | Status: AC
  Filled 2018-03-02: qty 30

## 2018-03-02 MED ORDER — ROCURONIUM BROMIDE 10 MG/ML (PF) SYRINGE
PREFILLED_SYRINGE | INTRAVENOUS | Status: DC | PRN
  Administered 2018-03-02: 100 mg via INTRAVENOUS

## 2018-03-02 MED ORDER — KETOROLAC TROMETHAMINE 30 MG/ML IJ SOLN
30.0000 mg | Freq: Once | INTRAMUSCULAR | Status: AC | PRN
  Administered 2018-03-02: 30 mg via INTRAVENOUS

## 2018-03-02 MED ORDER — PHENOL 1.4 % MT LIQD: 1.0000 | OROMUCOSAL | Status: DC | PRN

## 2018-03-02 MED ORDER — ONDANSETRON HCL 4 MG/2ML IJ SOLN: 4.0000 mg | Freq: Four times a day (QID) | INTRAMUSCULAR | Status: DC | PRN

## 2018-03-02 MED ORDER — PROMETHAZINE HCL 25 MG/ML IJ SOLN
INTRAMUSCULAR | Status: AC
  Filled 2018-03-02: qty 1

## 2018-03-02 MED ORDER — MENTHOL 3 MG MT LOZG: 1.0000 | LOZENGE | OROMUCOSAL | Status: DC | PRN

## 2018-03-02 MED ORDER — VANCOMYCIN HCL 1000 MG IV SOLR
INTRAVENOUS | Status: DC | PRN
  Administered 2018-03-02: 1000 mg

## 2018-03-02 MED ORDER — PROPOFOL 10 MG/ML IV BOLUS
INTRAVENOUS | Status: DC | PRN
  Administered 2018-03-02: 200 mg via INTRAVENOUS

## 2018-03-02 MED ORDER — BISACODYL 5 MG PO TBEC: 5.0000 mg | DELAYED_RELEASE_TABLET | Freq: Every day | ORAL | Status: DC | PRN

## 2018-03-02 SURGICAL SUPPLY — 60 items
BANDAGE ACE 6X5 VEL STRL LF (GAUZE/BANDAGES/DRESSINGS) ×3 IMPLANT
BANDAGE ELASTIC 4 VELCRO ST LF (GAUZE/BANDAGES/DRESSINGS) ×2 IMPLANT
BANDAGE ELASTIC 6 VELCRO ST LF (GAUZE/BANDAGES/DRESSINGS) ×2 IMPLANT
BIT DRILL CALIBRATED 4.3X320MM (BIT) IMPLANT
BIT DRILL CROWE POINT TWST 4.3 (DRILL) IMPLANT
BLADE SURG 10 STRL SS (BLADE) ×3 IMPLANT
BNDG COHESIVE 4X5 TAN STRL (GAUZE/BANDAGES/DRESSINGS) ×3 IMPLANT
BNDG GAUZE ELAST 4 BULKY (GAUZE/BANDAGES/DRESSINGS) ×3 IMPLANT
CANISTER SUCTION WELLS/JOHNSON (MISCELLANEOUS) ×3 IMPLANT
CLOSURE WOUND 1/2 X4 (GAUZE/BANDAGES/DRESSINGS) ×1
COVER MAYO STAND STRL (DRAPES) ×3 IMPLANT
COVER SURGICAL LIGHT HANDLE (MISCELLANEOUS) ×3 IMPLANT
COVER WAND RF STERILE (DRAPES) ×3 IMPLANT
DRAPE C-ARM 42X72 X-RAY (DRAPES) ×3 IMPLANT
DRAPE C-ARMOR (DRAPES) ×3 IMPLANT
DRAPE HALF SHEET 40X57 (DRAPES) ×3 IMPLANT
DRAPE IMP U-DRAPE 54X76 (DRAPES) ×3 IMPLANT
DRILL CALIBRATED 4.3X320MM (BIT) ×3
DRILL CROWE POINT TWIST 4.3 (DRILL) ×3
DRSG AQUACEL AG ADV 3.5X 6 (GAUZE/BANDAGES/DRESSINGS) ×3 IMPLANT
DRSG PAD ABDOMINAL 8X10 ST (GAUZE/BANDAGES/DRESSINGS) ×2 IMPLANT
DURAPREP 26ML APPLICATOR (WOUND CARE) ×3 IMPLANT
ELECT CAUTERY BLADE 6.4 (BLADE) ×3 IMPLANT
ELECT REM PT RETURN 9FT ADLT (ELECTROSURGICAL) ×3
ELECTRODE REM PT RTRN 9FT ADLT (ELECTROSURGICAL) ×1 IMPLANT
GAUZE SPONGE 4X4 12PLY STRL (GAUZE/BANDAGES/DRESSINGS) ×3 IMPLANT
GAUZE XEROFORM 1X8 LF (GAUZE/BANDAGES/DRESSINGS) ×3 IMPLANT
GLOVE BIOGEL PI IND STRL 8 (GLOVE) ×1 IMPLANT
GLOVE BIOGEL PI INDICATOR 8 (GLOVE) ×2
GLOVE ECLIPSE 8.0 STRL XLNG CF (GLOVE) ×3 IMPLANT
GOWN STRL REUS W/ TWL LRG LVL3 (GOWN DISPOSABLE) ×2 IMPLANT
GOWN STRL REUS W/ TWL XL LVL3 (GOWN DISPOSABLE) ×2 IMPLANT
GOWN STRL REUS W/TWL LRG LVL3 (GOWN DISPOSABLE) ×6
GOWN STRL REUS W/TWL XL LVL3 (GOWN DISPOSABLE) ×6
GUIDEPIN 3.2X17.5 THRD DISP (PIN) ×4 IMPLANT
GUIDEWIRE 2.6X80 BEAD TIP (WIRE) IMPLANT
GUIDWIRE 2.6X80 BEAD TIP (WIRE) ×3
KIT BASIN OR (CUSTOM PROCEDURE TRAY) ×3 IMPLANT
KIT TURNOVER KIT B (KITS) ×3 IMPLANT
NAIL TIBIAL PHOENIX 9.0X330MM (Nail) ×2 IMPLANT
NS IRRIG 1000ML POUR BTL (IV SOLUTION) ×3 IMPLANT
PACK ORTHO EXTREMITY (CUSTOM PROCEDURE TRAY) ×3 IMPLANT
PACK UNIVERSAL I (CUSTOM PROCEDURE TRAY) ×3 IMPLANT
PAD ARMBOARD 7.5X6 YLW CONV (MISCELLANEOUS) ×6 IMPLANT
SCREW CORT TI DBL LEAD 5X30 (Screw) ×2 IMPLANT
SCREW CORT TI DBL LEAD 5X38 (Screw) ×4 IMPLANT
SCREW CORT TI DBL LEAD 5X40 (Screw) ×4 IMPLANT
SCREW CORT TI DBL LEAD 5X44 (Screw) ×2 IMPLANT
SPONGE LAP 18X18 X RAY DECT (DISPOSABLE) ×2 IMPLANT
STAPLER VISISTAT 35W (STAPLE) IMPLANT
STRIP CLOSURE SKIN 1/2X4 (GAUZE/BANDAGES/DRESSINGS) ×1 IMPLANT
SUT ETHILON 3 0 PS 1 (SUTURE) ×3 IMPLANT
SUT MNCRL AB 4-0 PS2 18 (SUTURE) ×4 IMPLANT
SUT VIC AB 1 CT1 27 (SUTURE) ×3
SUT VIC AB 1 CT1 27XBRD ANBCTR (SUTURE) ×1 IMPLANT
SUT VIC AB 2-0 CT1 27 (SUTURE) ×3
SUT VIC AB 2-0 CT1 TAPERPNT 27 (SUTURE) ×1 IMPLANT
TUBE CONNECTING 12'X1/4 (SUCTIONS) ×1
TUBE CONNECTING 12X1/4 (SUCTIONS) ×2 IMPLANT
YANKAUER SUCT BULB TIP NO VENT (SUCTIONS) ×3 IMPLANT

## 2018-03-02 NOTE — H&P (Signed)
ORTHOPAEDIC CONSULTATION  REQUESTING PHYSICIAN: Hiram Gash, MD  Chief Complaint: R tibia/fibula fracture  HPI: Leroy Walker is a 32 y.o. male with  Hockey related injury resulting in right lower leg deformity and inability to bear weight.  He was taken to [facility] long ER and was noted to have a proximal fibula and distal tibial fractures.  And no other areas of injury.  No skin concerns at that time compartments are soft.  He was transferred to [facility] due to operating room availability.  Patient tolerated pain overnight without issue.  He is a Development worker, international aid by trade.  Past Medical History:  Diagnosis Date  . Allergy   . Attention deficit disorder (ADD)   . Heart murmur    Past Surgical History:  Procedure Laterality Date  . HERNIA REPAIR     Social History   Socioeconomic History  . Marital status: Single    Spouse name: Not on file  . Number of children: Not on file  . Years of education: Not on file  . Highest education level: Not on file  Occupational History  . Not on file  Social Needs  . Financial resource strain: Not on file  . Food insecurity:    Worry: Not on file    Inability: Not on file  . Transportation needs:    Medical: Not on file    Non-medical: Not on file  Tobacco Use  . Smoking status: Never Smoker  . Smokeless tobacco: Never Used  Substance and Sexual Activity  . Alcohol use: Yes    Alcohol/week: 4.0 standard drinks    Types: 4 Cans of beer per week  . Drug use: Yes    Types: Marijuana  . Sexual activity: Never    Birth control/protection: None  Lifestyle  . Physical activity:    Days per week: Not on file    Minutes per session: Not on file  . Stress: Not on file  Relationships  . Social connections:    Talks on phone: Not on file    Gets together: Not on file    Attends religious service: Not on file    Active member of club or organization: Not on file    Attends meetings of clubs or organizations: Not on file   Relationship status: Not on file  Other Topics Concern  . Not on file  Social History Narrative   Employment: Government social research officer   Family History  Problem Relation Age of Onset  . Muscular dystrophy Mother   . Stroke Maternal Grandfather   . Stroke Paternal Grandmother   . Stroke Paternal Grandfather    Allergies  Allergen Reactions  . Cephalosporins Hives  . Penicillins Hives   Prior to Admission medications   Medication Sig Start Date End Date Taking? Authorizing Provider  azithromycin (ZITHROMAX) 500 MG tablet Take 1 tablet (500 mg total) by mouth daily. Patient not taking: Reported on 09/20/2015 01/04/14   Orma Flaming, MD  clarithromycin (BIAXIN) 500 MG tablet Take 1 tablet (500 mg total) by mouth 2 (two) times daily. 09/20/15   Leandrew Koyanagi, MD  fluticasone (FLONASE) 50 MCG/ACT nasal spray Place 2 sprays into both nostrils daily. 08/11/14   Orma Flaming, MD  lidocaine (XYLOCAINE) 2 % solution Use 1 teaspoon every 2 hours to swish and swallow or spit as needed for pain 09/20/15   Leandrew Koyanagi, MD  Multiple Vitamin (MULTIVITAMIN) tablet Take 1 tablet by mouth daily.  [provider]  predniSONE (DELTASONE) 20 MG tablet 3/2/1 single daily dose for 3 days 09/20/15   Leandrew Koyanagi, MD   Dg Tibia/fibula Right  Result Date: 03/01/2018 CLINICAL DATA:  32 year old male with trauma to right lower extremity. EXAM: RIGHT TIBIA AND FIBULA - 2 VIEW; RIGHT ANKLE - COMPLETE 3+ VIEW COMPARISON:  None. FINDINGS: There is a mildly displaced oblique fracture of the proximal fibula. There is a displaced oblique fracture of the distal tibia with approximately 6 mm distraction gap and 1 cm anterior displacement of distal fracture fragment. The bones are well mineralized. There is no dislocation. There is subcutaneous soft tissue edema of the distal leg. IMPRESSION: Mildly displaced oblique fractures of the proximal fibula and distal tibia. No dislocation. Electronically Signed    By: Anner Crete M.D.   On: 03/01/2018 22:27   Dg Ankle Complete Right  Result Date: 03/01/2018 CLINICAL DATA:  32 year old male with trauma to right lower extremity. EXAM: RIGHT TIBIA AND FIBULA - 2 VIEW; RIGHT ANKLE - COMPLETE 3+ VIEW COMPARISON:  None. FINDINGS: There is a mildly displaced oblique fracture of the proximal fibula. There is a displaced oblique fracture of the distal tibia with approximately 6 mm distraction gap and 1 cm anterior displacement of distal fracture fragment. The bones are well mineralized. There is no dislocation. There is subcutaneous soft tissue edema of the distal leg. IMPRESSION: Mildly displaced oblique fractures of the proximal fibula and distal tibia. No dislocation. Electronically Signed   By: Anner Crete M.D.   On: 03/01/2018 22:27   Family History Reviewed and non-contributory, no pertinent history of problems with bleeding or anesthesia      Review of Systems 14 system ROS conducted and negative except for that noted in HPI   OBJECTIVE  Vitals: Patient Vitals for the past 8 hrs:  BP Temp Temp src Pulse Resp SpO2  03/02/18 0420 127/79 98.3 F (36.8 C) Oral 61 16 96 %  03/02/18 0025 (!) 144/81 97.8 F (36.6 C) Oral 67 18 96 %  03/01/18 2327 (!) 146/79 - - 80 16 96 %   General: Alert, no acute distress Cardiovascular: No pedal edema Respiratory: No cyanosis, no use of accessory musculature GI: No organomegaly, abdomen is soft and non-tender Skin: No lesions in the area of chief complaint other than those listed below in MSK exam.  Neurologic: Sensation intact distally save for the below mentioned MSK exam Psychiatric: Patient is competent for consent with normal mood and affect Lymphatic: No axillary or cervical lymphadenopathy Extremities  RLE: splint CDI, +EHL though remainder of motor difficult to test due to splint, sensation intact distally with warm well perfused foot, no pain w passive stretch     Test  Results Imaging X-rays demonstrate a nondisplaced proximal fibula fracture and a translated distal diaphyseal tibia fracture without extension into the articular surface.  Labs cbc Recent Labs    03/01/18 2254  WBC 14.3*  HGB 14.3  HCT 42.6  PLT 244    Labs inflam No results for input(s): CRP in the last 72 hours.  Invalid input(s): ESR  Labs coag No results for input(s): INR, PTT in the last 72 hours.  Invalid input(s): PT  Recent Labs    03/01/18 2254  NA 137  K 3.6  CL 104  CO2 21*  GLUCOSE 104*  BUN 17  CREATININE 0.86  CALCIUM 9.3     ASSESSMENT AND PLAN: 32 y.o. male with the following: Right distal tibial fracture.  This patient requires inpatient admission to manage this problem appropriately.  Spoke to the patient about his options.  Nonoperative measures during is well-tolerated in a young patient and for predictability of healing we recommend a tibial nail.  Specific risks including rotational abnormality, infection, nonunion and malunion as well as periprosthetic fracture the patient understood and elected to proceed.  Plan for tibial nail this afternoon.  All questions were answered.  The risks benefits and alternatives were discussed with the patient including but not limited to the risks of nonoperative treatment, versus surgical intervention including infection, bleeding, nerve injury, periprosthetic fracture, the need for revision surgery, leg length discrepancy, gait change, blood clots, cardiopulmonary complications, morbidity, mortality, among others, and they were willing to proceed.

## 2018-03-02 NOTE — Plan of Care (Signed)
  Problem: Coping: Goal: Level of anxiety will decrease Outcome: Progressing   Problem: Pain Managment: Goal: General experience of comfort will improve Outcome: Progressing   Problem: Safety: Goal: Ability to remain free from injury will improve Outcome: Progressing   Problem: Skin Integrity: Goal: Risk for impaired skin integrity will decrease Outcome: Progressing   

## 2018-03-02 NOTE — Op Note (Signed)
Orthopaedic Surgery Operative Note (CSN: [id number])  Leroy Walker  Dec 30, 1985 Date of Surgery: 03/01/2018 - 03/02/2018   Diagnoses:  Right tibia fracture closed  Procedure: Right tibial IM nail [id number]  Closed treatment of fibula fracture - 27781   Operative Finding Successful completion of planned procedure.  Fracture alignment was near-anatomic at the end of the case.  We able to get good fixation with 3 distal screws and 2 proximal.  Patient's fibula fracture is amenable to non-operative closed treatment.  Stress views of the ankle demonstrated no opening at the end of the case.  Post-operative plan: The patient will be nonweightbearing in a boot.  The patient will be readmitted overnight for observation.  DVT prophylaxis aspirin 325/day.  Pain control with PRN pain medication preferring oral medicines.  Follow up plan will be scheduled in approximately 7 days for incision check and XR.  Post-Op Diagnosis: Same Surgeons:Primary: Bjorn PippinVarkey, Medea Deines T, MD Assistants: Kingsley PlanBrandon Perry OPA-C [facility] ROOM 05 Anesthesia: General Antibiotics: Clinda 900mg  preop, Vancomycin 1000mg  locally  Tourniquet time: * No tourniquets in log * Estimated Blood Loss: 100 Complications: None Specimens: None Implants: Implant Name Type Inv. Item Serial No. Manufacturer Lot No. LRB No. Used Action  NAIL TIBIAL [place] 9.0X330MM - [id number] Nail NAIL TIBIAL [place] 9.0X330MM  [employer]IMMER [employer]ON(ORTH,TRAU,BIO,SG) [id number] Right 1 Implanted  SCREW CORTICAL 5X40MM - [id number] Screw SCREW CORTICAL 5X40MM  ZIMMER [employer]ON(ORTH,TRAU,BIO,SG) [id number] Right 1 Implanted  SCREW CORTICAL 5X40MM - [id number] Screw SCREW CORTICAL 5X40MM  ZIMMER RECON(ORTH,TRAU,BIO,SG) [id number] Right 1 Implanted  SCREW CORT TI DBLE LEAD 5X38MM - [id number] Screw SCREW CORT TI DBLE LEAD 5X38MM  ZIMMER [employer]ON(ORTH,TRAU,BIO,SG) [id number] Right 1 Implanted  SCREW CORT TI DBLE LEAD 5X38MM - [id number] Screw SCREW CORT TI DBLE LEAD 5X38MM  ZIMMER  RECON(ORTH,TRAU,BIO,SG) [id number] Right 1 Implanted  SCREW TI DBL LEAD CORT 5X30MM - [id number] Screw SCREW TI DBL LEAD CORT 5X30MM  ZIMMER [employer]ON(ORTH,TRAU,BIO,SG) [id number] Right 1 Implanted    Indications for Surgery:   Leroy Walker is a 32 y.o. male with hockey related injury resulting in a closed tibia distal diaphyseal fracture as well as a nondisplaced proximal fibula fracture.  Patient had no other areas of injury.  We discussed closed versus open management the patient elected proceed with intramedullary nailing of the tibia.  Benefits and risks of operative and nonoperative management were discussed prior to surgery with patient/guardian(s) and informed consent form was completed.  Specific risks including infection, need for additional surgery, compartment syndrome, nonunion, malunion, hardware failure and anterior knee pain were all discussed.   Procedure:   The patient was identified in the preoperative holding area where the surgical site was marked. The patient was taken to the OR where a procedural timeout was called and the above noted anesthesia was induced.  The patient was positioned supine on a radiolucent bed with bone from.  Preoperative antibiotics were dosed.  The patient's right knee and leg was prepped and draped in the usual sterile fashion.  A second preoperative timeout was called.      We began with a lateral parapatellar approach to the proximal tibia.  Incision was made midline overlying the distal one half of the patella as well as the proximal aspect of the patellar tendon.  We dissected down to layer 1 sharply and then raised thick skin flaps.  We are then able to move the tissue laterally and expose the lateral aspect of the patella and the patellar tendon.  Retinaculum was incised  sharply taking care to avoid involvement of the capsule thus we did not become intra-articular.  We then were able to sharply release the lateral retinaculum for later closure to allow the  patella to translate medially for the semi-extended nailing position.   Once this was performed we then began with our approach to the proximal must with a tibia and placed a guidewire on orthogonal views at the medial aspect of the lateral tibial eminence.  This was placed just off the anterior lip of the tibia as is typical for starting point for tibial nail.  This was then advanced on orthogonal views and an entry reamer was used to open the tibial canal.   Once this was performed were able to advance a ball-tipped guidewire down the length of the tibia and held the fracture reduced while the wire was passed across the fracture site itself.  This was passed to the level of the distal physeal scar.  This point we obtained a measurement using fluoroscopy to guide her management.  We measured for a 33cm nail.  We then began with reaming.  Sequentially reamed up from an 8 mm size to a 10.85mm size containing chatter with our largest reamers.  We selected a 9mm nail based on this.  Fracture was held reduced throughout the reaming process.   This point a [place] biomet  9mm x 33 cm and placed under orthogonal fluoroscopic images to the appropriate position.  Were happy with our length rotation and alignment and placed 2 proximal interlocks as well as 3 distal interlocks with the distal interlocks being placed through perfect circle technique.   Final assessment of rotation and alignment was appropriate and we are satisfied with final fluoroscopic images.  The incision was thoroughly irrigated and closed in a multilayer fashion with absorbable sutures. A sterile dressing was placed.   The patient was awoken from general anesthesia and taken to the PACU in stable condition without complication.   Janace Litten, OPA-C, present and scrubbed throughout the case, critical for completion in a timely fashion, and for retraction, instrumentation, closure.

## 2018-03-02 NOTE — Anesthesia Preprocedure Evaluation (Signed)
Anesthesia Evaluation  Patient identified by MRN, date of birth, ID band Patient awake    Reviewed: Allergy & Precautions, NPO status , Patient's Chart, lab work & pertinent test results  History of Anesthesia Complications Negative for: history of anesthetic complications  Airway Mallampati: II  TM Distance: >3 FB Neck ROM: Full    Dental no notable dental hx.    Pulmonary neg pulmonary ROS,    Pulmonary exam normal        Cardiovascular negative cardio ROS Normal cardiovascular exam     Neuro/Psych negative neurological ROS  negative psych ROS   GI/Hepatic negative GI ROS, Neg liver ROS,   Endo/Other  negative endocrine ROS  Renal/GU negative Renal ROS  negative genitourinary   Musculoskeletal negative musculoskeletal ROS (+)   Abdominal   Peds  Hematology negative hematology ROS (+)   Anesthesia Other Findings   Reproductive/Obstetrics                             Anesthesia Physical Anesthesia Plan  ASA: I  Anesthesia Plan: General   Post-op Pain Management:    Induction: Intravenous  PONV Risk Score and Plan: 2 and Ondansetron, Dexamethasone, Midazolam and Treatment may vary due to age or medical condition  Airway Management Planned: Oral ETT  Additional Equipment: None  Intra-op Plan:   Post-operative Plan: Extubation in OR  Informed Consent: I have reviewed the patients History and Physical, chart, labs and discussed the procedure including the risks, benefits and alternatives for the proposed anesthesia with the patient or authorized representative who has indicated his/her understanding and acceptance.     Plan Discussed with:   Anesthesia Plan Comments:         Anesthesia Quick Evaluation  

## 2018-03-02 NOTE — ED Notes (Signed)
Wasted Fentanyl 50mcg with Theodis ShoveIlene Hodges Charge Nurse. Pxyis would not document waste properly.

## 2018-03-02 NOTE — Progress Notes (Signed)
RN verified the presence of a signed informed consent that matches stated procedure by patient. Verified armband matches patient's stated name and birth date. Verified NPO status and that all jewelry, contact, glasses, dentures, and partials had been removed (if applicable).  

## 2018-03-02 NOTE — Transfer of Care (Signed)
Immediate Anesthesia Transfer of Care Note  Patient: Leroy Walker  Procedure(s) Performed: INTRAMEDULLARY (IM) NAIL TIBIAL (Right Leg Lower)  Patient Location: PACU  Anesthesia Type:General  Level of Consciousness: awake and patient cooperative  Airway & Oxygen Therapy: Patient Spontanous Breathing and Patient connected to nasal cannula oxygen  Post-op Assessment: Report given to RN, Post -op Vital signs reviewed and stable and Patient moving all extremities  Post vital signs: Reviewed and stable  Last Vitals:  Vitals Value Taken Time  BP 162/110 03/02/2018  5:06 PM  Temp 37.2 C 03/02/2018  5:06 PM  Pulse 86 03/02/2018  5:08 PM  Resp 13 03/02/2018  5:08 PM  SpO2 99 % 03/02/2018  5:08 PM  Vitals shown include unvalidated device data.  Last Pain:  Vitals:   03/02/18 0823  TempSrc:   PainSc: 4       Patients Stated Pain Goal: 0 (03/02/18 [id number])  Complications: No apparent anesthesia complications

## 2018-03-02 NOTE — Progress Notes (Signed)
Pt resting at this time. No acute distress noted. IV fluids continue per order. Pt has been NPO since midnight expect for meds with sips. Pt c/o pain in right leg. Medicated as needed. Pt is scheduled for surgery, has had CHG bath. Continue to monitor.

## 2018-03-02 NOTE — Anesthesia Procedure Notes (Signed)
Procedure Name: Intubation Date/Time: 03/02/2018 3:08 PM Performed by: Shireen QuanButler, Madalen Gavin R, CRNA Pre-anesthesia Checklist: Patient identified, Emergency Drugs available, Suction available, Patient being monitored and Timeout performed Patient Re-evaluated:Patient Re-evaluated prior to induction Oxygen Delivery Method: Circle system utilized Preoxygenation: Pre-oxygenation with 100% oxygen Induction Type: IV induction Ventilation: Mask ventilation without difficulty Laryngoscope Size: Miller and 2 Grade View: Grade I Tube type: Oral Tube size: 7.5 mm Number of attempts: 1 Airway Equipment and Method: Patient positioned with wedge pillow and Stylet Placement Confirmation: ETT inserted through vocal cords under direct vision,  positive ETCO2 and breath sounds checked- equal and bilateral Secured at: 23 cm Tube secured with: Tape Dental Injury: Teeth and Oropharynx as per pre-operative assessment  Comments: Intubation by Sena HitchJeremy Blount, SRNA. DL x 1 with Miller 2, grade 1 view, ETT passed easily through vocal cords. + EtCO2, bilateral breath sounds. Atraumatic intubation - JPB

## 2018-03-02 NOTE — Interval H&P Note (Signed)
Discussed case, risks and benefits with patient again.  All questions answered, no change to history.  Aleesha Ringstad MD  

## 2018-03-02 NOTE — Progress Notes (Signed)
Orthopedic Tech Progress Note Patient Details:  Shelly CossCharles R Magistro III 04-14-1985 [id number]  Ortho Devices Type of Ortho Device: CAM walker Ortho Device/Splint Location: delivered to or Ortho Device/Splint Interventions: Application   Post Interventions Patient Tolerated: Well Instructions Provided: Care of device, Adjustment of device   Trinna PostMartinez, Severin Bou J 03/02/2018, 5:19 PM

## 2018-03-02 NOTE — Progress Notes (Signed)
[facility]

## 2018-03-03 ENCOUNTER — Encounter (HOSPITAL_COMMUNITY): Payer: Self-pay | Admitting: Orthopaedic Surgery

## 2018-03-03 LAB — CBC
HCT: 36.2 % — ABNORMAL LOW (ref 39.0–52.0)
Hemoglobin: 11.7 g/dL — ABNORMAL LOW (ref 13.0–17.0)
MCH: 31.6 pg (ref 26.0–34.0)
MCHC: 32.3 g/dL (ref 30.0–36.0)
MCV: 97.8 fL (ref 80.0–100.0)
Platelets: 221 10*3/uL (ref 150–400)
RBC: 3.7 MIL/uL — ABNORMAL LOW (ref 4.22–5.81)
RDW: 11.9 % (ref 11.5–15.5)
WBC: 9.2 10*3/uL (ref 4.0–10.5)
nRBC: 0 % (ref 0.0–0.2)

## 2018-03-03 LAB — BASIC METABOLIC PANEL
Anion gap: 10 (ref 5–15)
BUN: 12 mg/dL (ref 6–20)
CO2: 25 mmol/L (ref 22–32)
Calcium: 8.7 mg/dL — ABNORMAL LOW (ref 8.9–10.3)
Chloride: 103 mmol/L (ref 98–111)
Creatinine, Ser: 1.14 mg/dL (ref 0.61–1.24)
GFR calc Af Amer: >60 mL/min (ref >60)
GFR calc non Af Amer: >60 mL/min (ref >60)
Glucose, Bld: 134 mg/dL — ABNORMAL HIGH (ref 70–99)
Potassium: 3.9 mmol/L (ref 3.5–5.1)
SODIUM: 138 mmol/L (ref 135–145)

## 2018-03-03 MED ORDER — ACETAMINOPHEN 500 MG PO TABS: 1000.0000 mg | ORAL_TABLET | Freq: Three times a day (TID) | ORAL | 0 refills | Status: AC

## 2018-03-03 MED ORDER — OXYCODONE HCL 5 MG PO TABS: ORAL_TABLET | ORAL | 0 refills | Status: AC

## 2018-03-03 MED ORDER — OMEPRAZOLE 20 MG PO CPDR: 20.0000 mg | DELAYED_RELEASE_CAPSULE | Freq: Every day | ORAL | 0 refills | Status: AC

## 2018-03-03 MED ORDER — ASPIRIN EC 325 MG PO TBEC: 325.0000 mg | DELAYED_RELEASE_TABLET | Freq: Every day | ORAL | 0 refills | Status: AC

## 2018-03-03 MED ORDER — ONDANSETRON HCL 4 MG PO TABS: 4.0000 mg | ORAL_TABLET | Freq: Three times a day (TID) | ORAL | 1 refills | Status: AC | PRN

## 2018-03-03 MED ORDER — MELOXICAM 7.5 MG PO TABS: 7.5000 mg | ORAL_TABLET | Freq: Every day | ORAL | 2 refills | Status: AC

## 2018-03-03 NOTE — Discharge Summary (Signed)
Patient ID: Leroy Walker MRN: [id number] DOB/AGE: [birth year] 33 y.o.  Admit date: 03/01/2018 Discharge date: 03/03/2018  Admission Diagnoses: R tibia fracture  Discharge Diagnoses:  Active Problems:   Displaced comminuted fracture of shaft of right tibia, initial encounter for closed fracture   Past Medical History:  Diagnosis Date  . Allergy   . Attention deficit disorder (ADD)   . Heart murmur      Procedures Performed: R tibial IM nail  Discharged Condition: good  Hospital Course: Patient brought in from ER for surgery.  Tolerated procedure well.  Was kept for monitoring overnight for pain control and medical monitoring postop and was found to be stable for DC home the morning after surgery.  Patient was instructed on specific activity restrictions and all questions were answered.   Consults: None  Significant Diagnostic Studies: No additional pertinent studies  Treatments: Surgery  Discharge Exam:  splint CDI, +EHL though remainder of motor difficult to test due to splint, sensation intact distally with warm well perfused foot, no pain w passive stretch   Disposition: Discharge disposition: 01-Home or Self Care       Discharge Instructions    Call MD for:  persistant nausea and vomiting   Complete by:  As directed    Call MD for:  redness, tenderness, or signs of infection (pain, swelling, redness, odor or green/yellow discharge around incision site)   Complete by:  As directed    Call MD for:  severe uncontrolled pain   Complete by:  As directed    Diet - low sodium heart healthy   Complete by:  As directed    Discharge instructions   Complete by:  As directed    Ramond Marrow MD, MPH [facility] [address] [phone] (tel)   [phone] (fax)   POST-OPERATIVE INSTRUCTIONS   WOUND CARE Please keep dressing clean dry and intact until followup.  You may shower on Post-Op Day #2. You must keep dressing  dry during this process and may find that a plastic bag taped around the leg or alternatively a towel based bath may be a better option.   EXERCISES  Please use crutches or a walker to avoid weight bearing.   POST-OP MEDICINES A multi-modal approach will be used to treat your pain. Oxycodone - This is a strong narcotic, to be used only on an "as needed" basis for pain. Acetaminophen 500mg - A non-narcotic pain medicine.  Use 1000mg  three times a day for the first 14 days after surgery   Zofran 4mg  - This is an anti-nausea medicine to be used only if you are having nausea or vomitting. Aspirin 325mg  - This is a medicine used to help prevent blood clots.  Please use it daily.   If you have any adverse effects with the medications, please call our office.  FOLLOW-UP If you develop a Fever (>101.5), Redness or Drainage from the surgical incision site, please call our office to arrange for an evaluation. Please call the office to schedule a follow-up appointment for your suture removal, 10-14 days post-operatively.  IF YOU HAVE ANY QUESTIONS, PLEASE FEEL FREE TO CALL OUR OFFICE.  HELPFUL INFORMATION You should wean off your narcotic medicines as soon as you are able.  Most patients will be off or using minimal narcotics before their first postop appointment.    We suggest you use the pain medication the first night prior to going to bed, in order to ease any pain  when the anesthesia wears off. You should avoid taking pain medications on an empty stomach as it will make you nauseous.  Do not drink alcoholic beverages or take illicit drugs when taking pain medications.  In most states it is against the law to drive while you are in a splint or sling.  And certainly against the law to drive while taking narcotics.  You may return to work/school in the next couple of days when you feel up to it.    Pain medication may make you constipated.  Below are a few solutions to try in this  order: Decrease the amount of pain medication if you aren't having pain. Drink lots of decaffeinated fluids. Drink prune juice and/or each dried prunes  If the first 3 don't work start with additional solutions Take Colace - an over-the-counter stool softener Take Senokot - an over-the-counter laxative Take Miralax - a stronger over-the-counter laxative   Increase activity slowly   Complete by:  As directed      Allergies as of 03/03/2018      Reactions   Cephalosporins Hives   Penicillins Hives      Medication List    TAKE these medications   acetaminophen 500 MG tablet Commonly known as:  TYLENOL Take 2 tablets (1,000 mg total) by mouth every 8 (eight) hours for 14 days.   aspirin EC 325 MG tablet Take 1 tablet (325 mg total) by mouth daily.   azithromycin 500 MG tablet Commonly known as:  ZITHROMAX Take 1 tablet (500 mg total) by mouth daily.   clarithromycin 500 MG tablet Commonly known as:  BIAXIN Take 1 tablet (500 mg total) by mouth 2 (two) times daily.   fluticasone 50 MCG/ACT nasal spray Commonly known as:  FLONASE Place 2 sprays into both nostrils daily.   lidocaine 2 % solution Commonly known as:  XYLOCAINE Use 1 teaspoon every 2 hours to swish and swallow or spit as needed for pain   meloxicam 7.5 MG tablet Commonly known as:  MOBIC Take 1 tablet (7.5 mg total) by mouth daily.   multivitamin tablet Take 1 tablet by mouth daily.   omeprazole 20 MG capsule Commonly known as:  PRILOSEC Take 1 capsule (20 mg total) by mouth daily for 14 days.   ondansetron 4 MG tablet Commonly known as:  ZOFRAN Take 1 tablet (4 mg total) by mouth every 8 (eight) hours as needed for up to 7 days for nausea or vomiting.   oxyCODONE 5 MG immediate release tablet Commonly known as:  Oxy IR/ROXICODONE Take 1-2 pills every 6 hrs as needed for pain, no more than 6 per day   predniSONE 20 MG tablet Commonly known as:  DELTASONE 3/2/1 single daily dose for 3 days

## 2018-03-03 NOTE — Evaluation (Signed)
Physical Therapy Evaluation Patient Details Name: Leroy Walker MRN: [id number] DOB: [birth year] Today's Date: 03/03/2018   History of Present Illness  Pt admitted with R proximal fibula fx and distal tibia fracture suffered while playing hockey.  He is s/p R tibial IM nailing.  Clinical Impression  Pt admitted with above diagnosis. Pt currently with functional limitations due to the deficits listed below (see PT Problem List).  Pt will benefit from skilled PT to increase their independence and safety with mobility to allow discharge to the venue listed below.  Pt preferred stability of RW due to pain and balance.  He did have 1 LOB with crutches.  He is planning on going to his parents' house which is handicapped accessible.  Will d/c from PT at this time.  Please re-order if status changes.     Follow Up Recommendations No PT follow up    Equipment Recommendations  Rolling walker with 5" wheels    Recommendations for Other Services       Precautions / Restrictions Precautions Required Braces or Orthoses: Other Brace Other Brace: CAM boot Restrictions Weight Bearing Restrictions: Yes RLE Weight Bearing: Non weight bearing      Mobility  Bed Mobility Overal bed mobility: Modified Independent             General bed mobility comments: cues on using L LE to A R LE  Transfers Overall transfer level: Modified independent               General transfer comment: cues for safe technique  Ambulation/Gait Ambulation/Gait assistance: Min guard;Supervision Gait Distance (Feet): 120 Feet Assistive device: Rolling walker (2 wheeled);Crutches Gait Pattern/deviations: Step-to pattern Gait velocity: decreased   General Gait Details: Ambulated with RW and also crutches.  One small LOB with crutches when he stepped too far forward.  Discussed pros and cons of crutches and RW.  Due to current pain and stability, pt prefers RW.  Stairs            Wheelchair  Mobility    Modified Rankin (Stroke Patients Only)       Balance Overall balance assessment: Needs assistance         Standing balance support: During functional activity Standing balance-Leahy Scale: Fair Standing balance comment: requires UE support for dynamic, can stand briefly without UE in static position                             Pertinent Vitals/Pain Pain Assessment: 0-10 Pain Score: 5  Pain Location: R knee Pain Descriptors / Indicators: Dull;Throbbing;Pressure Pain Intervention(s): Patient requesting pain meds-RN notified;Monitored during session;Limited activity within patient's tolerance;Ice applied    Home Living Family/patient expects to be discharged to:: Private residence Living Arrangements: Spouse/significant other;Parent Available Help at Discharge: Family Type of Home: House Home Access: Elevator     Home Layout: One level Home Equipment: Wheelchair - manual;Crutches Additional Comments: parent's house in handicapped accessible    Prior Function Level of Independence: Independent         Comments: works in [employer]   Dominant Hand: Right    Extremity/Trunk Assessment   Upper Extremity Assessment Upper Extremity Assessment: Overall WFL for tasks assessed    Lower Extremity Assessment Lower Extremity Assessment: RLE deficits/detail RLE Deficits / Details: CAM boot intact RLE: Unable to fully assess due to pain       Communication  Communication: No difficulties  Cognition Arousal/Alertness: Awake/alert Behavior During Therapy: WFL for tasks assessed/performed Overall Cognitive Status: Within Functional Limits for tasks assessed                                        General Comments      Exercises     Assessment/Plan    PT Assessment Patent does not need any further PT services  PT Problem List         PT Treatment Interventions      PT Goals  (Current goals can be found in the Care Plan section)  Acute Rehab PT Goals Patient Stated Goal: home today PT Goal Formulation: All assessment and education complete, DC therapy    Frequency     Barriers to discharge        Co-evaluation               AM-PAC PT "6 Clicks" Mobility  Outcome Measure Help needed turning from your back to your side while in a flat bed without using bedrails?: None Help needed moving from lying on your back to sitting on the side of a flat bed without using bedrails?: A Little Help needed moving to and from a bed to a chair (including a wheelchair)?: None Help needed standing up from a chair using your arms (e.g., wheelchair or bedside chair)?: None Help needed to walk in hospital room?: A Little Help needed climbing 3-5 steps with a railing? : A Little 6 Click Score: 21    End of Session Equipment Utilized During Treatment: Gait belt Activity Tolerance: Patient tolerated treatment well Patient left: in bed;with call bell/phone within reach Nurse Communication: Mobility status;Patient requests pain meds PT Visit Diagnosis: Difficulty in walking, not elsewhere classified (R26.2)    Time: [id number] PT Time Calculation (min) (ACUTE ONLY): 48 min   Charges:   PT Evaluation $PT Eval Low Complexity: 1 Low          Blima Jaimes L. Katrinka BlazingSmith, [place] CarolinaPT Pager [phone] 03/03/2018   Enzo MontgomeryKaren L Jackye Dever 03/03/2018, 10:10 AM

## 2018-03-16 NOTE — Anesthesia Postprocedure Evaluation (Signed)
Anesthesia Post Note  Patient: Shelly Cossharles R Gopaul III  Procedure(s) Performed: INTRAMEDULLARY (IM) NAIL TIBIAL (Right Leg Lower)     Patient location during evaluation: PACU Anesthesia Type: General Level of consciousness: sedated Pain management: pain level controlled Vital Signs Assessment: post-procedure vital signs reviewed and stable Respiratory status: spontaneous breathing Cardiovascular status: stable Anesthetic complications: yes Anesthetic complication details: PONV   Last Vitals:  Vitals:   03/03/18 0347 03/03/18 1127  BP: (!) 128/56 137/79  Pulse: 76 81  Resp: 16 20  Temp: 36.9 C 37 C  SpO2: 96% 97%    Last Pain:  Vitals:   03/03/18 1127  TempSrc: Oral  PainSc:    Pain Goal: Patients Stated Pain Goal: 4 (03/02/18 1809)               Caren MacadamJohn F Sheria Rosello Jr

## 2019-02-02 ENCOUNTER — Other Ambulatory Visit: Payer: Self-pay

## 2019-02-02 DIAGNOSIS — Z20822 Contact with and (suspected) exposure to covid-19: Secondary | ICD-10-CM

## 2019-02-03 LAB — NOVEL CORONAVIRUS, NAA: SARS-CoV-2, NAA: DETECTED — AB

## 2019-02-09 ENCOUNTER — Ambulatory Visit: Payer: Self-pay | Admitting: *Deleted

## 2019-02-09 ENCOUNTER — Telehealth: Payer: Self-pay | Admitting: General Practice

## 2019-02-09 NOTE — Telephone Encounter (Signed)
Clinical call entered. Needs documentation for work, requesting recorded notes from Nurse to be uploaded to [employer]

## 2019-02-09 NOTE — Telephone Encounter (Signed)
Message from Erick Blinks sent at 02/09/2019 11:08 AM EST  Summary: Nurse call back   Pt called back, is currently Covid 19 positive and has questions for Nurse Triage. Needs documentation for work.          Returned call to patient who states that he is currently on the phone with the Health Department. Pt encouraged to follow the recommendations provided by the health department. Understanding verbalized. Pt states that the health department is going to write him a letter for work.

## 2020-03-26 IMAGING — RF DG C-ARM 61-120 MIN
1 series · 7 of 7 positions shown · non-contrast
Comparison: 03/01/2018

CLINICAL DATA: ORIF of a right tibial fracture.

EXAM:
DG C-ARM 61-120 MIN; RIGHT TIBIA AND FIBULA - 2 VIEW

[Series 1: run · 7 of 7 slices shown]
[im 1/7]
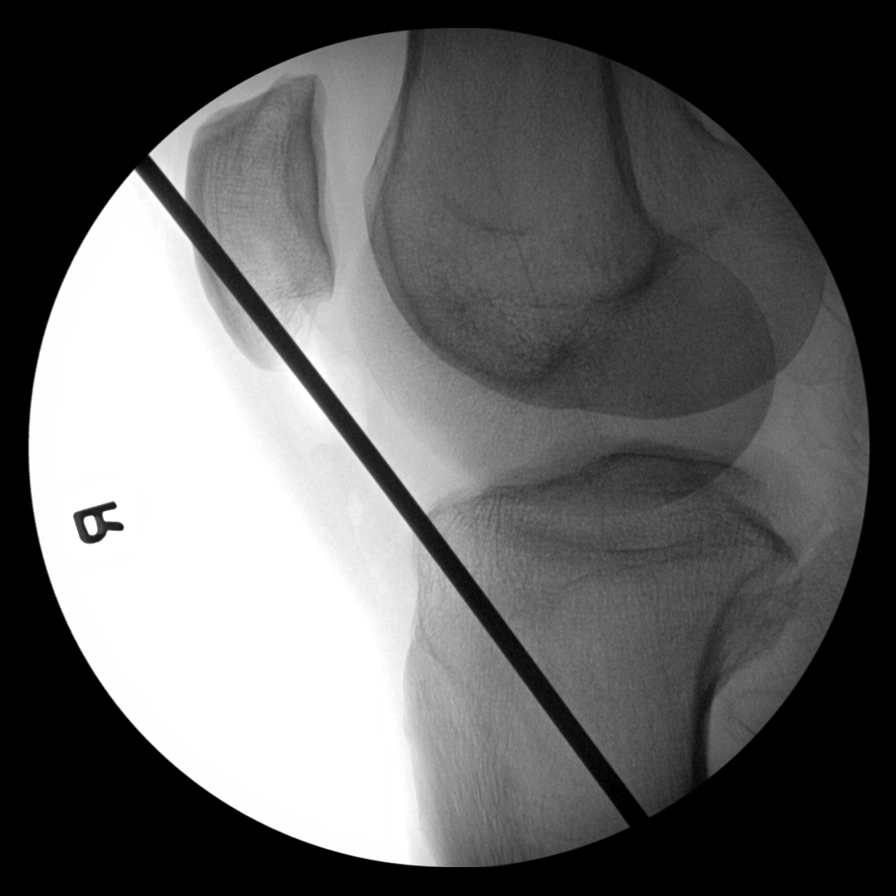
[im 2/7]
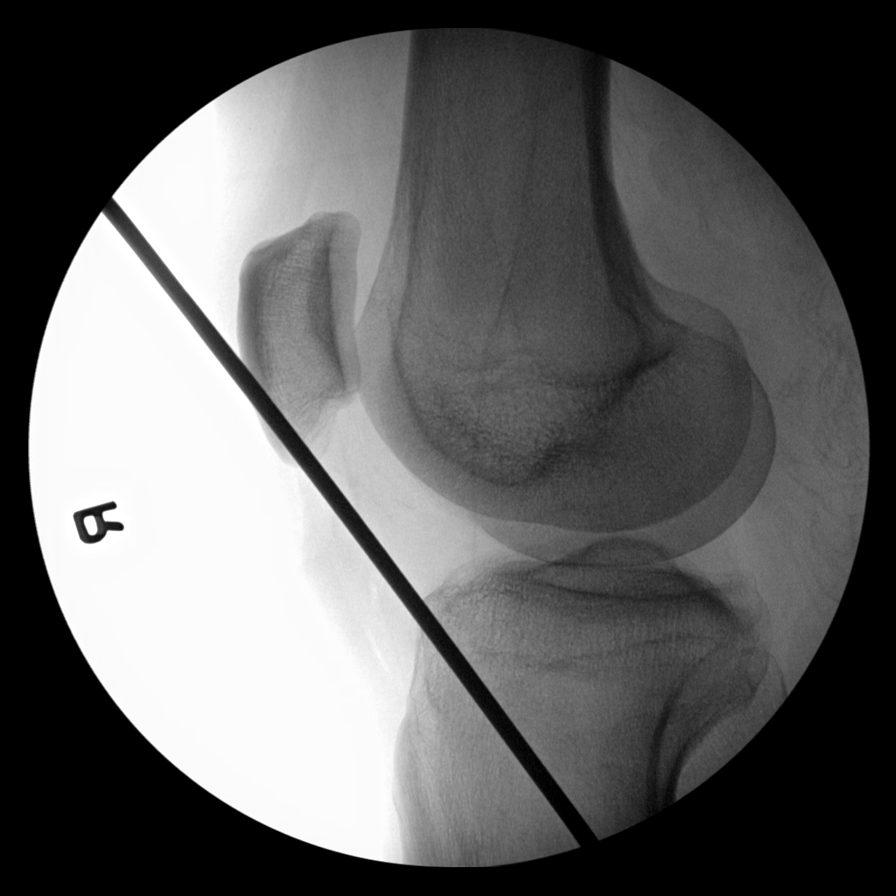
[im 3/7]
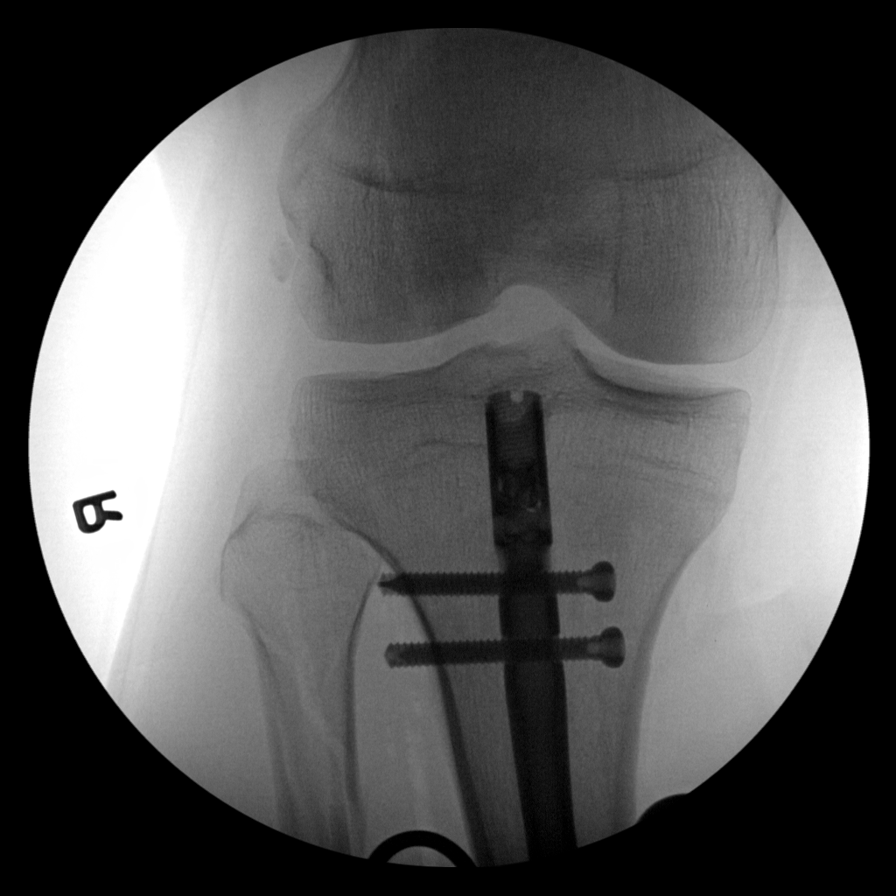
[im 4/7]
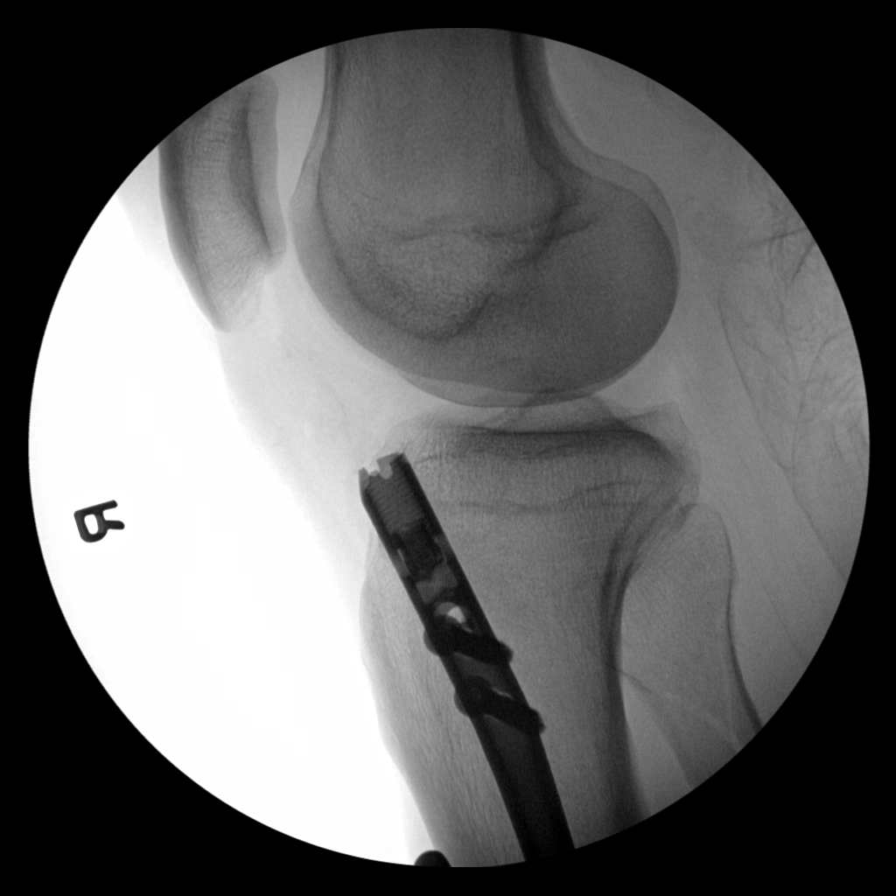
[im 5/7]
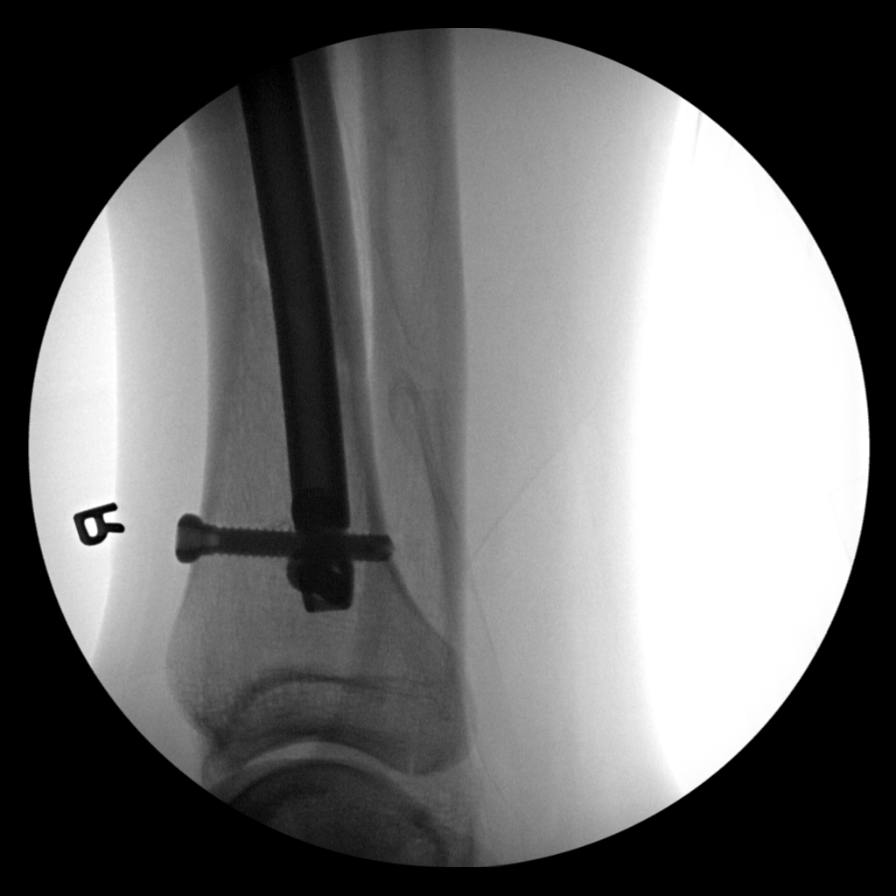
[im 6/7]
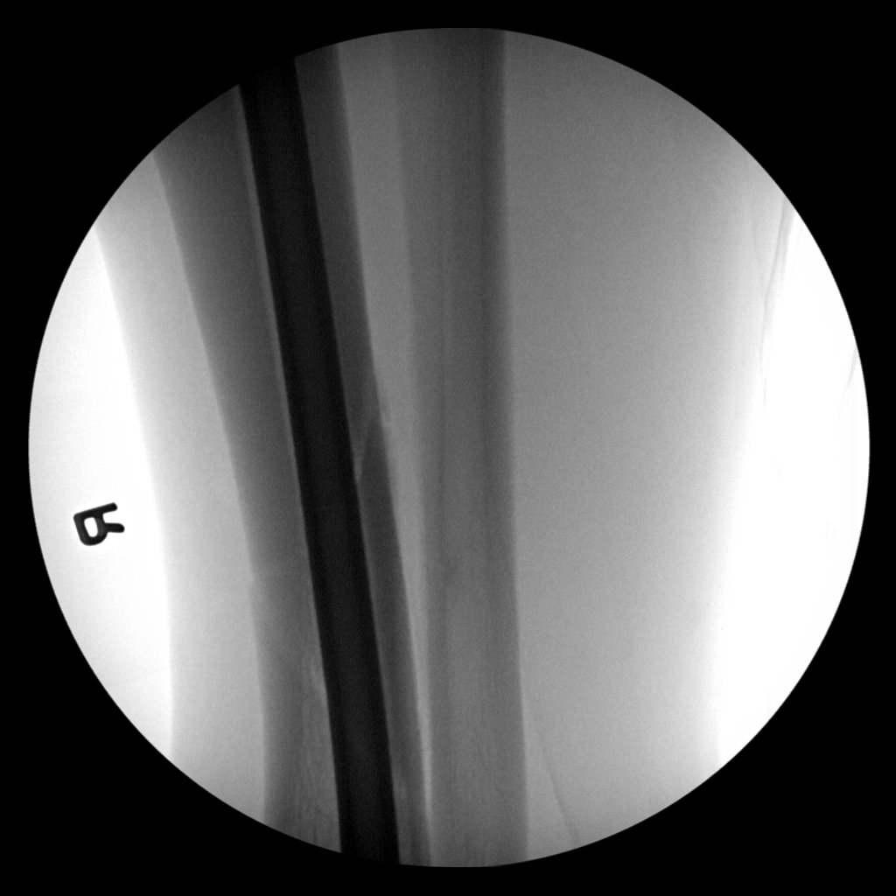
[im 7/7]
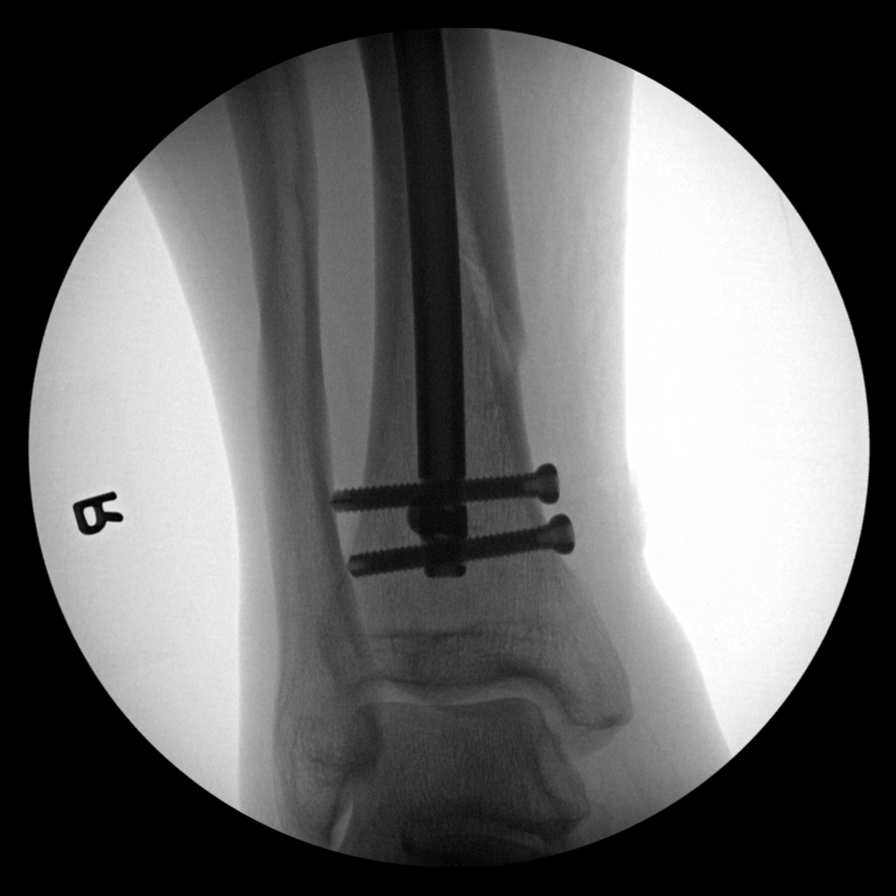

[7 of 7 positions shown; findings below may reference images not displayed]

FINDINGS: Seven spot fluoro graphic images show placement of an intramedullary
rod spanning the distal tibial shaft fracture. The orthopedic
hardware is well-seated. The fracture has been reduced into near
anatomic alignment.

No new fractures or evidence of an operative complication.
IMPRESSION: Well-aligned right tibial fracture following ORIF.

## 2020-07-03 DIAGNOSIS — E782 Mixed hyperlipidemia: Secondary | ICD-10-CM | POA: Diagnosis not present

## 2020-07-03 DIAGNOSIS — E559 Vitamin D deficiency, unspecified: Secondary | ICD-10-CM | POA: Diagnosis not present

## 2020-07-03 DIAGNOSIS — Z23 Encounter for immunization: Secondary | ICD-10-CM | POA: Diagnosis not present

## 2020-07-03 DIAGNOSIS — R718 Other abnormality of red blood cells: Secondary | ICD-10-CM | POA: Diagnosis not present

## 2020-07-03 DIAGNOSIS — Z0000 Encounter for general adult medical examination without abnormal findings: Secondary | ICD-10-CM | POA: Diagnosis not present

## 2020-11-28 DIAGNOSIS — L509 Urticaria, unspecified: Secondary | ICD-10-CM | POA: Diagnosis not present

## 2021-01-17 DIAGNOSIS — J3081 Allergic rhinitis due to animal (cat) (dog) hair and dander: Secondary | ICD-10-CM | POA: Diagnosis not present

## 2021-01-17 DIAGNOSIS — T781XXD Other adverse food reactions, not elsewhere classified, subsequent encounter: Secondary | ICD-10-CM | POA: Diagnosis not present

## 2021-01-17 DIAGNOSIS — J3089 Other allergic rhinitis: Secondary | ICD-10-CM | POA: Diagnosis not present
